# Patient Record
Sex: Female | Born: 1975 | Hispanic: No | Marital: Married | Smoking: Never smoker
Health system: Southern US, Community
[De-identification: ages and names within clinical notes are randomized; demographics above are authoritative.]

---

## 2001-08-22 ENCOUNTER — Inpatient Hospital Stay (HOSPITAL_COMMUNITY): Admission: AD | Admit: 2001-08-22 | Discharge: 2001-08-22 | Payer: Self-pay | Admitting: *Deleted

## 2002-01-04 ENCOUNTER — Inpatient Hospital Stay (HOSPITAL_COMMUNITY): Admission: AD | Admit: 2002-01-04 | Discharge: 2002-01-07 | Payer: Self-pay | Admitting: *Deleted

## 2004-09-02 ENCOUNTER — Emergency Department (HOSPITAL_COMMUNITY): Admission: EM | Admit: 2004-09-02 | Discharge: 2004-09-02 | Payer: Self-pay | Admitting: Emergency Medicine

## 2006-07-02 ENCOUNTER — Ambulatory Visit: Payer: Self-pay | Admitting: Nurse Practitioner

## 2006-07-03 ENCOUNTER — Ambulatory Visit: Payer: Self-pay | Admitting: *Deleted

## 2007-02-27 ENCOUNTER — Encounter (INDEPENDENT_AMBULATORY_CARE_PROVIDER_SITE_OTHER): Payer: Self-pay | Admitting: *Deleted

## 2007-06-19 ENCOUNTER — Ambulatory Visit: Payer: Self-pay | Admitting: Internal Medicine

## 2007-06-19 LAB — CONVERTED CEMR LAB
Basophils Relative: 0 % (ref 0–1)
Lymphs Abs: 2.3 10*3/uL (ref 0.7–4.0)
Monocytes Absolute: 0.4 10*3/uL (ref 0.1–1.0)
Monocytes Relative: 9 % (ref 3–12)
Neutro Abs: 2 10*3/uL (ref 1.7–7.7)
Neutrophils Relative %: 39 % — ABNORMAL LOW (ref 43–77)
WBC: 5 10*3/uL (ref 4.0–10.5)

## 2008-08-20 ENCOUNTER — Ambulatory Visit: Payer: Self-pay | Admitting: Internal Medicine

## 2009-02-24 ENCOUNTER — Ambulatory Visit: Payer: Self-pay | Admitting: Family Medicine

## 2009-02-24 ENCOUNTER — Encounter (INDEPENDENT_AMBULATORY_CARE_PROVIDER_SITE_OTHER): Payer: Self-pay | Admitting: Adult Health

## 2009-02-24 LAB — CONVERTED CEMR LAB
ALT: 10 units/L (ref 0–35)
AST: 15 units/L (ref 0–37)
Albumin: 4 g/dL (ref 3.5–5.2)
Basophils Absolute: 0 10*3/uL (ref 0.0–0.1)
Basophils Relative: 0 % (ref 0–1)
CO2: 22 meq/L (ref 19–32)
Calcium: 9.2 mg/dL (ref 8.4–10.5)
Eosinophils Relative: 2 % (ref 0–5)
Glucose, Bld: 78 mg/dL (ref 70–99)
HCT: 32.8 % — ABNORMAL LOW (ref 36.0–46.0)
Lymphs Abs: 2.4 10*3/uL (ref 0.7–4.0)
Monocytes Relative: 8 % (ref 3–12)
Neutro Abs: 2.3 10*3/uL (ref 1.7–7.7)
RBC: 3.8 M/uL — ABNORMAL LOW (ref 3.87–5.11)
Total Protein: 7.2 g/dL (ref 6.0–8.3)

## 2010-12-02 ENCOUNTER — Ambulatory Visit (HOSPITAL_COMMUNITY)
Admission: RE | Admit: 2010-12-02 | Discharge: 2010-12-02 | Disposition: A | Discharge status: Home / Self Care | Payer: Self-pay | Source: Ambulatory Visit | Attending: Family Medicine | Admitting: Family Medicine

## 2010-12-02 ENCOUNTER — Other Ambulatory Visit (HOSPITAL_COMMUNITY): Payer: Self-pay | Admitting: Family Medicine

## 2010-12-02 DIAGNOSIS — M545 Low back pain, unspecified: Secondary | ICD-10-CM | POA: Insufficient documentation

## 2010-12-02 DIAGNOSIS — R52 Pain, unspecified: Secondary | ICD-10-CM

## 2010-12-02 DIAGNOSIS — M546 Pain in thoracic spine: Secondary | ICD-10-CM | POA: Insufficient documentation

## 2011-07-20 ENCOUNTER — Emergency Department (HOSPITAL_COMMUNITY)
Admission: EM | Admit: 2011-07-20 | Discharge: 2011-07-20 | Disposition: A | Discharge status: Home / Self Care | Payer: PRIVATE HEALTH INSURANCE | Attending: Emergency Medicine | Admitting: Emergency Medicine

## 2011-07-20 ENCOUNTER — Encounter (HOSPITAL_COMMUNITY): Payer: Self-pay | Admitting: Emergency Medicine

## 2011-07-20 DIAGNOSIS — H579 Unspecified disorder of eye and adnexa: Secondary | ICD-10-CM | POA: Insufficient documentation

## 2011-07-20 DIAGNOSIS — Z77098 Contact with and (suspected) exposure to other hazardous, chiefly nonmedicinal, chemicals: Secondary | ICD-10-CM | POA: Insufficient documentation

## 2011-07-20 DIAGNOSIS — IMO0002 Reserved for concepts with insufficient information to code with codable children: Secondary | ICD-10-CM | POA: Insufficient documentation

## 2011-07-20 DIAGNOSIS — H11419 Vascular abnormalities of conjunctiva, unspecified eye: Secondary | ICD-10-CM | POA: Insufficient documentation

## 2011-07-20 DIAGNOSIS — H5789 Other specified disorders of eye and adnexa: Secondary | ICD-10-CM | POA: Insufficient documentation

## 2011-07-20 MED ORDER — FLUORESCEIN SODIUM 1 MG OP STRP
1.0000 | ORAL_STRIP | Freq: Once | OPHTHALMIC | Status: DC
  Filled 2011-07-20: qty 1

## 2011-07-20 MED ORDER — TETRACAINE HCL 0.5 % OP SOLN
2.0000 [drp] | Freq: Once | OPHTHALMIC | Status: DC
  Filled 2011-07-20: qty 2

## 2011-07-20 NOTE — ED Notes (Signed)
Patient states both eyes are burning after splashing cleaning fluid into both eyes. Patient left eye appears red and the right eye is clear. Neither right or left eye appear to be swollen. Patient resting and watching TV with NAD at this time.

## 2011-07-20 NOTE — ED Provider Notes (Signed)
I saw and evaluated the patient, reviewed the resident's note and I agree with the findings and plan.  Chemicals splash to left eye. No change in vision, burning, itching or pain. Irrigated for 20 minutes. PH is 7.  No fluorescein uptake. Vision normal.  Glynn Octave, MD 07/20/11 2031

## 2011-07-20 NOTE — ED Notes (Signed)
Flush complete. Patient states her left eye fells better.

## 2011-07-20 NOTE — ED Notes (Signed)
Pt working and sts A456 cleaner splashed into left eye; eye redness noted

## 2011-07-20 NOTE — ED Notes (Signed)
Morgan Lens placed in patients left eye with 1L warm NS flushing. Patient resting with NAD at this time.

## 2011-07-20 NOTE — ED Provider Notes (Signed)
History     CSN: [id number]  Arrival date & time 07/20/11  1547   First MD Initiated Contact with Patient 07/20/11 1601      Chief Complaint  Patient presents with  . Chemical Exposure    (Consider location/radiation/quality/duration/timing/severity/associated sxs/prior treatment) Patient is a 36 y.o. female presenting with eye problem.  Eye Problem  This is a new problem. The current episode started less than 1 hour ago. The problem occurs constantly. The problem has been rapidly improving. The patient is experiencing no pain. She does not wear contacts. Associated symptoms include eye redness. Pertinent negatives include no blurred vision, no decreased vision, no discharge and no photophobia. Treatments tried: irrigation.    History reviewed. No pertinent past medical history.  History reviewed. No pertinent past surgical history.  History reviewed. No pertinent family history.  History  Substance Use Topics  . Smoking status: Never Smoker   . Smokeless tobacco: Not on file  . Alcohol Use: No    OB History    Grav Para Term Preterm Abortions TAB SAB Ect Mult Living                  Review of Systems  HENT: Negative for facial swelling.   Eyes: Positive for redness and itching. Negative for blurred vision, photophobia, pain, discharge and visual disturbance.  All other systems reviewed and are negative.    Allergies  Review of patient's allergies indicates no known allergies.  Home Medications  No current outpatient prescriptions on file.  BP 156/101  Pulse 60  Temp(Src) 98.5 F (36.9 C) (Oral)  Resp 18  SpO2 99%  Physical Exam  Constitutional: She is oriented to person, place, and time. She appears well-developed and well-nourished. No distress.  HENT:  Head: Atraumatic.  Nose: Nose normal.  Eyes: EOM are normal. Pupils are equal, round, and reactive to light. Right eye exhibits no discharge and no exudate. No foreign body present in the right eye.  Left eye exhibits no discharge and no exudate. No foreign body present in the left eye. Left conjunctiva is injected.  Neck: Normal range of motion. Neck supple.  Cardiovascular: Intact distal pulses.   Pulmonary/Chest: Not tachypneic. No respiratory distress.  Abdominal: Soft. There is no tenderness.  Musculoskeletal: Normal range of motion.  Neurological: She is alert and oriented to person, place, and time. Gait normal.  Psychiatric: She has a normal mood and affect. Her speech is normal and behavior is normal.    ED Course  Procedures (including critical care time)  Labs Reviewed - No data to display No results found.   1. Chemical exposure of eye       MDM  37 yo female with left eye chemical exposure to A456 Disinfectant.  She states a small amount of diluted cleaner was splashed in her left eye.  No pain, burning, or itching.  Irrigated it for 15 minutes prior to arrival.  pH between 7-8.  VA 20/20.  Mild conjunctival injection.  No tearing.  No fluorescein uptake.  Will irrigate.  Irrigated.  Pt feels better, will dc home.        Warnell Forester, MD 07/20/11 1758

## 2012-06-24 ENCOUNTER — Encounter: Payer: Self-pay | Admitting: Internal Medicine

## 2012-06-24 ENCOUNTER — Ambulatory Visit (INDEPENDENT_AMBULATORY_CARE_PROVIDER_SITE_OTHER): Payer: PRIVATE HEALTH INSURANCE | Admitting: Internal Medicine

## 2012-06-24 VITALS — BP 118/77 | HR 84 | Temp 97.7°F | Ht 64.0 in | Wt 186.4 lb

## 2012-06-24 DIAGNOSIS — D649 Anemia, unspecified: Secondary | ICD-10-CM

## 2012-06-24 DIAGNOSIS — M2550 Pain in unspecified joint: Secondary | ICD-10-CM

## 2012-06-24 LAB — CBC WITH DIFFERENTIAL/PLATELET
Basophils Absolute: 0 10*3/uL (ref 0.0–0.1)
Eosinophils Absolute: 0.1 10*3/uL (ref 0.0–0.7)
Eosinophils Relative: 1 % (ref 0–5)
Hemoglobin: 11 g/dL — ABNORMAL LOW (ref 12.0–15.0)
Lymphs Abs: 2.1 10*3/uL (ref 0.7–4.0)
MCH: 28.6 pg (ref 26.0–34.0)
MCHC: 33.1 g/dL (ref 30.0–36.0)
Platelets: 168 10*3/uL (ref 150–400)
RBC: 3.85 MIL/uL — ABNORMAL LOW (ref 3.87–5.11)
WBC: 4.6 10*3/uL (ref 4.0–10.5)

## 2012-06-24 LAB — TSH: TSH: 1.51 u[IU]/mL (ref 0.350–4.500)

## 2012-06-24 LAB — CK: Total CK: 242 U/L — ABNORMAL HIGH (ref 7–177)

## 2012-06-24 NOTE — Patient Instructions (Addendum)
-  I have drawn a lot of labs for concerns of different types of arthritis or muscle pains, I will let you know when I get the results  Please be sure to bring all of your medications with you to every visit.  Should you have any new or worsening symptoms, please be sure to call the clinic at [phone].

## 2012-06-24 NOTE — Assessment & Plan Note (Addendum)
4-6 weeks in duration, seems to be worsening, age & bilateral in nature suggests high suspicion for rheumatological etiology.  Cannot r/o thyroid disorder or myositis/myalgia.    Check TSH, ESR, CRP, CK, RF, anti CCP, ANA Check urine for sediment Check CMET for electrolyte derangements/kidney fxn Check CBC with diff to screen for infectious etiology  ADDENDUM: all labs wnl except CK mildly elevated, which may suggestion inclusion body myositis, but may also occur in black patients as well (per uptodate); will have patient repeat CK lab work next week.  Patient aware of results and will make lab appt

## 2012-06-24 NOTE — Progress Notes (Signed)
Subjective:   Patient ID: Andrea Bird female   DOB: 1976/02/25 36 y.o.   MRN: [id number]  HPI: Andrea Bird is a 37 y.o. woman with no significant PMH who presents with c/o diffuse joint and muscle pains.  She reports breast tenderness, pain in wrists, elbows, waist, upper & low back, but it is not clear if pain is only in joints or if there is a muscular component as well.  She is not able to describe the pain well, but I believe this to be due to a language barrier. She reports pain was intermittent in the past but now more frequent to almost constant.  She reports pain currently and notes last being pain free on the night prior.  She was recently seen by another physician on 06/10/12 (records pending) who prescribed ranitidine & ibuprofen, from which she has received minimal relief. She notes occasional difficulty swallowing of both liquids and solids with dry mouth but denies dry eyes.  She denies SOB, dizziness & HA.  No numbness or tingling. She has never had symptoms as this before, nor does she recall her parents having similar symptoms.  No heart palpitations. No problems with BMs or urination.  No rashes. No vision changes.  No past medical history on file.  Current Outpatient Prescriptions  Medication Sig Dispense Refill  . ibuprofen (ADVIL,MOTRIN) 800 MG tablet Take 800 mg by mouth 3 (three) times daily.      . ranitidine (ZANTAC) 300 MG tablet Take 300 mg by mouth at bedtime.       No family history on file.  History   Social History  . Marital Status: Divorced    Spouse Name: N/A    Number of Children: N/A  . Years of Education: N/A   Social History Main Topics  . Smoking status: Never Smoker   . Smokeless tobacco: None  . Alcohol Use: No  . Drug Use: No  . Sexually Active:    Other Topics Concern  . 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  Patient is a well-developed and well-nourished woman in no acute distress and cooperative with exam.  Head: Normocephalic and atraumatic Mouth: no erythema or exudates Eyes: PERRL, EOMI, conjunctivae normal, No scleral icterus.  Neck: Supple, Trachea midline normal ROM, No JVD, mass, thyromegaly present.  Cardiovascular: RRR, S1 normal, S2 normal, no MRG, pulses symmetric and intact bilaterally Pulmonary/Chest: CTAB, no wheezes, rales, or rhonchi Abdominal: Soft. Non-tender, non-distended, bowel sounds are normal, no masses, organomegaly, or guarding present.  Musculoskeletal: No joint deformities, erythema, or stiffness, ROM full and nontender, but she did present with 4/5 muscle strength with upper extremities being weaker than lower. Hematology: no cervical adenopathy.  Neurological: A&O x3, cranial nerve II-XII are grossly intact, no focal motor deficit, sensory intact to light touch bilaterally.  Skin: Warm, dry and intact. No rash, cyanosis, or clubbing.  Psychiatric: Normal mood and affect. speech and behavior is normal. Judgment and thought content normal. Cognition and memory are normal.   Assessment & Plan:  Case and care discussed with Dr. Dalphine Handing. Please see problem oriented  charting for further details.

## 2012-06-25 LAB — COMPLETE METABOLIC PANEL WITH GFR
AST: 16 U/L (ref 0–37)
Albumin: 4.2 g/dL (ref 3.5–5.2)
Alkaline Phosphatase: 51 U/L (ref 39–117)
BUN: 14 mg/dL (ref 6–23)
CO2: 26 mEq/L (ref 19–32)
Chloride: 105 mEq/L (ref 96–112)
Glucose, Bld: 75 mg/dL (ref 70–99)
Potassium: 4.1 mEq/L (ref 3.5–5.3)
Sodium: 139 mEq/L (ref 135–145)
Total Bilirubin: 0.3 mg/dL (ref 0.3–1.2)

## 2012-06-25 LAB — URINALYSIS, ROUTINE W REFLEX MICROSCOPIC
Leukocytes, UA: NEGATIVE
Nitrite: NEGATIVE
Protein, ur: NEGATIVE mg/dL
pH: 6 (ref 5.0–8.0)

## 2012-06-25 LAB — ANA: Anti Nuclear Antibody(ANA): NEGATIVE

## 2012-06-25 LAB — SEDIMENTATION RATE: Sed Rate: 11 mm/hr (ref 0–22)

## 2012-06-25 LAB — CYCLIC CITRUL PEPTIDE ANTIBODY, IGG: Cyclic Citrullin Peptide Ab: <2 U/mL (ref 0.0–5.0)

## 2012-06-27 DIAGNOSIS — D649 Anemia, unspecified: Secondary | ICD-10-CM | POA: Insufficient documentation

## 2012-06-27 NOTE — Assessment & Plan Note (Signed)
Unclear etiology, will check anemia panel with repeat blood work (CK) to be done on 07/03/12.

## 2012-06-27 NOTE — Addendum Note (Signed)
Addended by: Belia Heman on: 06/27/2012 11:55 AM   Modules accepted: Orders

## 2012-07-03 ENCOUNTER — Other Ambulatory Visit (INDEPENDENT_AMBULATORY_CARE_PROVIDER_SITE_OTHER): Payer: 59

## 2012-07-03 DIAGNOSIS — M2550 Pain in unspecified joint: Secondary | ICD-10-CM

## 2012-07-03 DIAGNOSIS — D649 Anemia, unspecified: Secondary | ICD-10-CM

## 2012-07-04 LAB — ANEMIA PANEL
%SAT: 22 % (ref 20–55)
ABS Retic: 93.6 10*3/uL (ref 19.0–186.0)
Folate: 9.7 ng/mL
RBC.: 4.07 MIL/uL (ref 3.87–5.11)
Retic Ct Pct: 2.3 % (ref 0.4–2.3)
UIBC: 281 ug/dL (ref 125–400)
Vitamin B-12: 372 pg/mL (ref 211–911)

## 2012-07-04 LAB — CK TOTAL AND CKMB (NOT AT ARMC)
CK, MB: 6.2 ng/mL — ABNORMAL HIGH (ref 0.3–4.0)
Relative Index: 1.1 (ref 0.0–2.5)

## 2012-07-05 ENCOUNTER — Other Ambulatory Visit: Payer: Self-pay | Admitting: Internal Medicine

## 2012-07-12 ENCOUNTER — Other Ambulatory Visit: Payer: Self-pay | Admitting: Internal Medicine

## 2012-07-12 DIAGNOSIS — Z1231 Encounter for screening mammogram for malignant neoplasm of breast: Secondary | ICD-10-CM

## 2012-07-12 DIAGNOSIS — N644 Mastodynia: Secondary | ICD-10-CM

## 2012-07-16 ENCOUNTER — Ambulatory Visit (HOSPITAL_COMMUNITY)
Admission: RE | Admit: 2012-07-16 | Discharge: 2012-07-16 | Disposition: A | Discharge status: Home / Self Care | Payer: 59 | Source: Ambulatory Visit | Attending: Internal Medicine | Admitting: Internal Medicine

## 2012-07-16 DIAGNOSIS — Z1231 Encounter for screening mammogram for malignant neoplasm of breast: Secondary | ICD-10-CM

## 2012-07-23 NOTE — Addendum Note (Signed)
Addended by: Angelina Ok F on: 07/23/2012 11:36 AM   Modules accepted: Orders

## 2012-08-05 ENCOUNTER — Ambulatory Visit
Admission: RE | Admit: 2012-08-05 | Discharge: 2012-08-05 | Disposition: A | Discharge status: Home / Self Care | Payer: 59 | Source: Ambulatory Visit | Attending: Internal Medicine | Admitting: Internal Medicine

## 2012-08-05 DIAGNOSIS — N644 Mastodynia: Secondary | ICD-10-CM

## 2014-04-13 ENCOUNTER — Encounter: Payer: Self-pay | Admitting: Internal Medicine

## 2014-10-21 ENCOUNTER — Encounter: Payer: Self-pay | Admitting: *Deleted

## 2014-11-04 ENCOUNTER — Other Ambulatory Visit (INDEPENDENT_AMBULATORY_CARE_PROVIDER_SITE_OTHER): Payer: 59

## 2014-11-04 ENCOUNTER — Ambulatory Visit (INDEPENDENT_AMBULATORY_CARE_PROVIDER_SITE_OTHER): Payer: 59 | Admitting: Family

## 2014-11-04 ENCOUNTER — Encounter: Payer: Self-pay | Admitting: Family

## 2014-11-04 VITALS — BP 100/68 | HR 65 | Temp 97.7°F | Resp 18 | Ht 64.0 in | Wt 180.8 lb

## 2014-11-04 DIAGNOSIS — M2550 Pain in unspecified joint: Secondary | ICD-10-CM

## 2014-11-04 LAB — CBC
HCT: 36.5 % (ref 36.0–46.0)
HEMOGLOBIN: 12.5 g/dL (ref 12.0–15.0)
MCHC: 34.3 g/dL (ref 30.0–36.0)
MCV: 84.6 fl (ref 78.0–100.0)
Platelets: 226 10*3/uL (ref 150.0–400.0)
RBC: 4.32 Mil/uL (ref 3.87–5.11)
RDW: 12.9 % (ref 11.5–15.5)
WBC: 4.5 10*3/uL (ref 4.0–10.5)

## 2014-11-04 LAB — C-REACTIVE PROTEIN: CRP: 0.1 mg/dL — AB (ref 0.5–20.0)

## 2014-11-04 LAB — SEDIMENTATION RATE: Sed Rate: 21 mm/hr (ref 0–22)

## 2014-11-04 NOTE — Patient Instructions (Signed)
Thank you for choosing [facility].  Summary/Instructions:  Please stop by the lab on the basement level of the building for your blood work. Your results will be released to MyChart (or called to you) after review, usually within 72 hours after test completion. If any changes need to be made, you will be notified at that same time.  If your symptoms worsen or fail to improve, please contact our office for further instruction, or in case of emergency go directly to the emergency room at the closest medical facility.     

## 2014-11-04 NOTE — Progress Notes (Signed)
Subjective:    Patient ID: Andrea Bird, female    DOB: December 29, 1975, 39 y.o.   MRN: [id number]  Chief Complaint  Patient presents with  . Establish Care    has been having back and shoulder pain, x1 year     HPI:  Andrea Bird is a 39 y.o. female with a PMH of normocytic anemia and multiple joint pain who presents today for an office visit to establish care.    1) Multiple joint pain - Associated symptom of pain located in her bilateral shoulder, chest, low back have been hurting for about a year. It is exacerbated by bending down and picking up something. Indicates that everytime she eats it exacerbates her chest. Cannot pick up heavy things. Unable to describe any of the pain only indicating that it hurts. The only modifying factor that makes it better is when she is not working. Denies any trauma to any of there area.  She was previously seen for similar concerns found to have an elevated creatinine kinase.   No Known Allergies   Outpatient Prescriptions Prior to Visit  Medication Sig Dispense Refill  . ibuprofen (ADVIL,MOTRIN) 800 MG tablet Take 800 mg by mouth 3 (three) times daily.    . ranitidine (ZANTAC) 300 MG tablet Take 300 mg by mouth at bedtime.     No facility-administered medications prior to visit.     History reviewed. No pertinent past medical history.   History reviewed. No pertinent past surgical history.   Family History  Problem Relation Age of Onset  . Healthy Mother   . Healthy Father     History   Social History  . Marital Status: Divorced    Spouse Name: N/A  . Number of Children: 1  . Years of Education: 14   Occupational History  . Environmental Services    Social History Main Topics  . Smoking status: Never Smoker   . Smokeless tobacco: Never Used  . Alcohol Use: No  . Drug Use: No  . Sexual Activity: Not on file   Other Topics Concern  . 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  Musculoskeletal: Positive for back pain and arthralgias.  Neurological: Negative for numbness and headaches.      Objective:    BP 100/68 mmHg  Pulse 65  Temp(Src) 97.7 F (36.5 C) (Oral)  Resp 18  Ht 5\' 4"  (1.626 m)  Wt 180 lb 12.8 oz (82.01 kg)  BMI 31.02 kg/m2  SpO2 99% Nursing note and vital signs reviewed.  Physical Exam  Constitutional: She is oriented to person, place, and time. She appears well-developed and well-nourished. No distress.  Neck:  Neck: No obvious deformity, discoloration, or edema. Unable to elicit palpable tenderness. Neck range of motion is full in all directions with no pain. Negative compression test and Spurling's test.  Cardiovascular: Normal rate, regular rhythm, normal heart sounds and intact distal pulses.   Pulmonary/Chest: Effort normal and breath sounds normal.  Musculoskeletal:  Bilateral shoulders: No obvious deformity, discoloration, or edema noted. No palpable tenderness able to be elicited. Displays full range of motion bilaterally with no pain. Strength is 4+ out of 5 in all directions. Ligamentous in impingement testing are negative.  Low back: No obvious deformity, discoloration, or edema of low back noted. Unable to elicit palpable tenderness upon palpation.  Patient displays full range of motion in all directions with no pain. Straight leg raise is negative. Distal pulses, sensation, and reflexes are intact and appropriate.  Neurological: She is alert and oriented to person, place, and time.  Skin: Skin is warm and dry.  Psychiatric: She has a normal mood and affect. Her behavior is normal. Judgment and thought content normal.       Assessment & Plan:   Problem List Items Addressed This Visit      Other   Multiple joint pain - Primary    Symptoms and exam inconsistent with any specific pathology and consistent with repetitive  motion given that symptoms are relieved when not at work. Continue conservative treatment at this time with 800 mg of ibuprofen 3 times a day for pain and inflammation. Recommend trialing a heat pack as needed. Discussed and demonstrated proper lifting techniques which patient understands. Obtain CBC, sedimentation rate, C-reactive protein, and creatinine kinase. Follow-up in one month to determine continued pain.      Relevant Orders   Sed Rate (ESR) (Completed)   C-reactive protein (Completed)   CK Total (and CKMB)   CBC (Completed)

## 2014-11-04 NOTE — Assessment & Plan Note (Addendum)
Symptoms and exam inconsistent with any specific pathology and consistent with repetitive motion given that symptoms are relieved when not at work. Continue conservative treatment at this time with 800 mg of ibuprofen 3 times a day for pain and inflammation. Recommend trialing a heat pack as needed. Discussed and demonstrated proper lifting techniques which patient understands. Obtain CBC, sedimentation rate, C-reactive protein, and creatinine kinase. Follow-up in one month to determine continued pain.

## 2014-11-04 NOTE — Progress Notes (Signed)
Pre visit review using our clinic review tool, if applicable. No additional management support is needed unless otherwise documented below in the visit note. 

## 2014-11-05 ENCOUNTER — Telehealth: Payer: Self-pay | Admitting: Family

## 2014-11-05 LAB — CK TOTAL AND CKMB (NOT AT ARMC)
CK, MB: 9.4 ng/mL — ABNORMAL HIGH (ref 0.0–5.0)
RELATIVE INDEX: 1.7 (ref 0.0–4.0)
Total CK: 561 U/L — ABNORMAL HIGH (ref 7–177)

## 2014-11-05 NOTE — Telephone Encounter (Signed)
Received value of elevated CK. This is consistent with previous readings. Will refer to rhematology for possible myositis.

## 2014-11-05 NOTE — Telephone Encounter (Signed)
Revonda StandardAllison with [facility] called to let you know patient's CKMB is Alert High 9.4

## 2014-11-06 ENCOUNTER — Telehealth: Payer: Self-pay | Admitting: Family

## 2014-11-06 DIAGNOSIS — R748 Abnormal levels of other serum enzymes: Secondary | ICD-10-CM

## 2014-11-06 NOTE — Telephone Encounter (Signed)
Please inform the patient that her lab results continue to show elevation of enzymes in her body consistent with muscle breakdown. Therefore I have placed a referral for her to see rhuematology for this.

## 2014-11-11 NOTE — Telephone Encounter (Signed)
Left detailed message letting pt know the results below.

## 2014-12-21 LAB — HEPATIC FUNCTION PANEL
ALT: 17 U/L (ref 7–35)
AST: 18 U/L (ref 13–35)
Alkaline Phosphatase: 56 U/L (ref 25–125)
BILIRUBIN, TOTAL: 0.7 mg/dL

## 2014-12-21 LAB — BASIC METABOLIC PANEL
BUN: 14 mg/dL (ref 4–21)
CREATININE: 0.7 mg/dL (ref <=1.1)
Glucose: 78 mg/dL
Potassium: 3.9 mmol/L (ref 3.4–5.3)
Sodium: 141 mmol/L (ref 137–147)

## 2014-12-30 ENCOUNTER — Encounter: Payer: Self-pay | Admitting: Family

## 2015-05-19 ENCOUNTER — Ambulatory Visit (INDEPENDENT_AMBULATORY_CARE_PROVIDER_SITE_OTHER): Payer: 59 | Admitting: Family

## 2015-05-19 ENCOUNTER — Encounter: Payer: Self-pay | Admitting: Family

## 2015-05-19 VITALS — BP 104/80 | HR 79 | Temp 97.8°F | Resp 16 | Ht 64.0 in | Wt 201.8 lb

## 2015-05-19 DIAGNOSIS — R0789 Other chest pain: Secondary | ICD-10-CM | POA: Insufficient documentation

## 2015-05-19 DIAGNOSIS — M542 Cervicalgia: Secondary | ICD-10-CM | POA: Insufficient documentation

## 2015-05-19 MED ORDER — NAPROXEN 500 MG PO TABS: 500.0000 mg | ORAL_TABLET | Freq: Two times a day (BID) | ORAL | Status: DC

## 2015-05-19 MED ORDER — PANTOPRAZOLE SODIUM 40 MG PO TBEC: 40.0000 mg | DELAYED_RELEASE_TABLET | Freq: Every day | ORAL | Status: DC

## 2015-05-19 NOTE — Progress Notes (Signed)
Subjective:    Patient ID: Andrea Bird, female    DOB: 04-28-1976, 39 y.o.   MRN: [id number]  Chief Complaint  Patient presents with  . Back Pain    has back pain at the top of her back that goes to her chest x2 years    HPI:  Andrea Bird is a 39 y.o. female who  has no past medical history on file. and presents today for a follow up office visit.   Seen in July 2016 by Rhumatology following last office appointment and diagnosed with polyarthralgia, insomnia and back ache. The elevated CK is believed to be a benign situation given her increased muscle mass. There is some concern for fibromyalgia. She was started on flexeril. Takes the medication as prescribed and does not feel significant improvement. Continues to experience the associated symptoms of back and chest pain. Pain is described as burning especially after she eats. Denies any radiating pains, chest pain with exertion, or shortness of breath.   No Known Allergies   Current Outpatient Prescriptions on File Prior to Visit  Medication Sig Dispense Refill  . ibuprofen (ADVIL,MOTRIN) 800 MG tablet Take 800 mg by mouth 3 (three) times daily.     No current facility-administered medications on file prior to visit.    Review of Systems  Constitutional: Negative for fever and chills.  Gastrointestinal: Negative for nausea, vomiting, abdominal pain, diarrhea and constipation.  Musculoskeletal: Positive for back pain and neck pain.  Neurological: Negative for weakness and numbness.      Objective:    BP 104/80 mmHg  Pulse 79  Temp(Src) 97.8 F (36.6 C) (Oral)  Resp 16  Ht 5\' 4"  (1.626 m)  Wt 201 lb 12.8 oz (91.536 kg)  BMI 34.62 kg/m2  SpO2 98% Nursing note and vital signs reviewed.  Physical Exam  Constitutional: She is oriented to person, place, and time. She appears well-developed and well-nourished. No distress.  Neck:  No obvious deformity, discoloration, or edema noted. Mild tenderness of paraspinal  musculature. Range of motion is intact and appropriate. Negative compression and Spurling's. Distal pulses, sensation, and reflexes are intact and appropriate.  Cardiovascular: Normal rate, regular rhythm, normal heart sounds and intact distal pulses.   Pulmonary/Chest: Effort normal and breath sounds normal.  Abdominal: Soft. Bowel sounds are normal. She exhibits no distension and no mass. There is no tenderness. There is no rebound and no guarding.  Neurological: She is alert and oriented to person, place, and time.  Skin: Skin is warm and dry.  Psychiatric: She has a normal mood and affect. Her behavior is normal. Judgment and thought content normal.       Assessment & Plan:   Problem List Items Addressed This Visit      Other   Muscle pain, cervical - Primary    Symptoms consistent with cervical muscle spasms as fibromyalgia points are minimal at present. Treat conservatively with naproxen, heat, and home exercise therapy. Refer to physical therapy for further assessment and management. Follow-up in one month or if symptoms worsen or fail to improve.      Relevant Medications   naproxen (NAPROSYN) 500 MG tablet   Other Relevant Orders   Ambulatory referral to Physical Therapy   H Pylori, IGM, IGG, IGA AB   Burning in the chest    Symptoms consistent with possible gastroesophageal reflux or peptic ulcer disease. Start pantoprazole. Obtain H. pylori. Follow-up in one month or sooner if needed.      Relevant Medications  pantoprazole (PROTONIX) 40 MG tablet

## 2015-05-19 NOTE — Patient Instructions (Signed)
Thank you for choosing [facility].  Summary/Instructions:  Your prescription(s) have been submitted to your pharmacy or been printed and provided for you. Please take as directed and contact our office if you believe you are having problem(s) with the medication(s) or have any questions.  Please stop by the lab on the basement level of the building for your blood work. Your results will be released to MyChart (or called to you) after review, usually within 72 hours after test completion. If any changes need to be made, you will be notified at that same time.  Referrals have been made during this visit. You should expect to hear back from our schedulers in about 7-10 days in regards to establishing an appointment with the specialists we discussed.   If your symptoms worsen or fail to improve, please contact our office for further instruction, or in case of emergency go directly to the emergency room at the closest medical facility.    Cervical Strain and Sprain With Rehab Cervical strain and sprain are injuries that commonly occur with "whiplash" injuries. Whiplash occurs when the neck is forcefully whipped backward or forward, such as during a motor vehicle accident or during contact sports. The muscles, ligaments, tendons, discs, and nerves of the neck are susceptible to injury when this occurs. RISK FACTORS Risk of having a whiplash injury increases if:  Osteoarthritis of the spine.  Situations that make head or neck accidents or trauma more likely.  High-risk sports (football, rugby, wrestling, hockey, auto racing, gymnastics, diving, contact karate, or boxing).  Poor strength and flexibility of the neck.  Previous neck injury.  Poor tackling technique.  Improperly fitted or padded equipment. SYMPTOMS   Pain or stiffness in the front or back of neck or both.  Symptoms may present immediately or up to 24 hours after injury.  Dizziness, headache, nausea, and  vomiting.  Muscle spasm with soreness and stiffness in the neck.  Tenderness and swelling at the injury site. PREVENTION  Learn and use proper technique (avoid tackling with the head, spearing, and head-butting; use proper falling techniques to avoid landing on the head).  Warm up and stretch properly before activity.  Maintain physical fitness:  Strength, flexibility, and endurance.  Cardiovascular fitness.  Wear properly fitted and padded protective equipment, such as padded soft collars, for participation in contact sports. PROGNOSIS  Recovery from cervical strain and sprain injuries is dependent on the extent of the injury. These injuries are usually curable in 1 week to 3 months with appropriate treatment.  RELATED COMPLICATIONS   Temporary numbness and weakness may occur if the nerve roots are damaged, and this may persist until the nerve has completely healed.  Chronic pain due to frequent recurrence of symptoms.  Prolonged healing, especially if activity is resumed too soon (before complete recovery). TREATMENT  Treatment initially involves the use of ice and medication to help reduce pain and inflammation. It is also important to perform strengthening and stretching exercises and modify activities that worsen symptoms so the injury does not get worse. These exercises may be performed at home or with a therapist. For patients who experience severe symptoms, a soft, padded collar may be recommended to be worn around the neck.  Improving your posture may help reduce symptoms. Posture improvement includes pulling your chin and abdomen in while sitting or standing. If you are sitting, sit in a firm chair with your buttocks against the back of the chair. While sleeping, try replacing your pillow with a small  towel rolled to 2 inches in diameter, or use a cervical pillow or soft cervical collar. Poor sleeping positions delay healing.  For patients with nerve root damage, which causes  numbness or weakness, the use of a cervical traction apparatus may be recommended. Surgery is rarely necessary for these injuries. However, cervical strain and sprains that are present at birth (congenital) may require surgery. MEDICATION   If pain medication is necessary, nonsteroidal anti-inflammatory medications, such as aspirin and ibuprofen, or other minor pain relievers, such as acetaminophen, are often recommended.  Do not take pain medication for 7 days before surgery.  Prescription pain relievers may be given if deemed necessary by your caregiver. Use only as directed and only as much as you need. HEAT AND COLD:   Cold treatment (icing) relieves pain and reduces inflammation. Cold treatment should be applied for 10 to 15 minutes every 2 to 3 hours for inflammation and pain and immediately after any activity that aggravates your symptoms. Use ice packs or an ice massage.  Heat treatment may be used prior to performing the stretching and strengthening activities prescribed by your caregiver, physical therapist, or athletic trainer. Use a heat pack or a warm soak. SEEK MEDICAL CARE IF:   Symptoms get worse or do not improve in 2 weeks despite treatment.  New, unexplained symptoms develop (drugs used in treatment may produce side effects). EXERCISES RANGE OF MOTION (ROM) AND STRETCHING EXERCISES - Cervical Strain and Sprain These exercises may help you when beginning to rehabilitate your injury. In order to successfully resolve your symptoms, you must improve your posture. These exercises are designed to help reduce the forward-head and rounded-shoulder posture which contributes to this condition. Your symptoms may resolve with or without further involvement from your physician, physical therapist or athletic trainer. While completing these exercises, remember:   Restoring tissue flexibility helps normal motion to return to the joints. This allows healthier, less painful movement and  activity.  An effective stretch should be held for at least 20 seconds, although you may need to begin with shorter hold times for comfort.  A stretch should never be painful. You should only feel a gentle lengthening or release in the stretched tissue. STRETCH- Axial Extensors  Lie on your back on the floor. You may bend your knees for comfort. Place a rolled-up hand towel or dish towel, about 2 inches in diameter, under the part of your head that makes contact with the floor.  Gently tuck your chin, as if trying to make a "double chin," until you feel a gentle stretch at the base of your head.  Hold __________ seconds. Repeat __________ times. Complete this exercise __________ times per day.  STRETCH - Axial Extension   Stand or sit on a firm surface. Assume a good posture: chest up, shoulders drawn back, abdominal muscles slightly tense, knees unlocked (if standing) and feet hip width apart.  Slowly retract your chin so your head slides back and your chin slightly lowers. Continue to look straight ahead.  You should feel a gentle stretch in the back of your head. Be certain not to feel an aggressive stretch since this can cause headaches later.  Hold for __________ seconds. Repeat __________ times. Complete this exercise __________ times per day. STRETCH - Cervical Side Bend   Stand or sit on a firm surface. Assume a good posture: chest up, shoulders drawn back, abdominal muscles slightly tense, knees unlocked (if standing) and feet hip width apart.  Without letting your nose or  shoulders move, slowly tip your right / left ear to your shoulder until your feel a gentle stretch in the muscles on the opposite side of your neck.  Hold __________ seconds. Repeat __________ times. Complete this exercise __________ times per day. STRETCH - Cervical Rotators   Stand or sit on a firm surface. Assume a good posture: chest up, shoulders drawn back, abdominal muscles slightly tense, knees  unlocked (if standing) and feet hip width apart.  Keeping your eyes level with the ground, slowly turn your head until you feel a gentle stretch along the back and opposite side of your neck.  Hold __________ seconds. Repeat __________ times. Complete this exercise __________ times per day. RANGE OF MOTION - Neck Circles   Stand or sit on a firm surface. Assume a good posture: chest up, shoulders drawn back, abdominal muscles slightly tense, knees unlocked (if standing) and feet hip width apart.  Gently roll your head down and around from the back of one shoulder to the back of the other. The motion should never be forced or painful.  Repeat the motion 10-20 times, or until you feel the neck muscles relax and loosen. Repeat __________ times. Complete the exercise __________ times per day. STRENGTHENING EXERCISES - Cervical Strain and Sprain These exercises may help you when beginning to rehabilitate your injury. They may resolve your symptoms with or without further involvement from your physician, physical therapist, or athletic trainer. While completing these exercises, remember:   Muscles can gain both the endurance and the strength needed for everyday activities through controlled exercises.  Complete these exercises as instructed by your physician, physical therapist, or athletic trainer. Progress the resistance and repetitions only as guided.  You may experience muscle soreness or fatigue, but the pain or discomfort you are trying to eliminate should never worsen during these exercises. If this pain does worsen, stop and make certain you are following the directions exactly. If the pain is still present after adjustments, discontinue the exercise until you can discuss the trouble with your clinician. STRENGTH - Cervical Flexors, Isometric  Face a wall, standing about 6 inches away. Place a small pillow, a ball about 6-8 inches in diameter, or a folded towel between your forehead and the  wall.  Slightly tuck your chin and gently push your forehead into the soft object. Push only with mild to moderate intensity, building up tension gradually. Keep your jaw and forehead relaxed.  Hold 10 to 20 seconds. Keep your breathing relaxed.  Release the tension slowly. Relax your neck muscles completely before you start the next repetition. Repeat __________ times. Complete this exercise __________ times per day. STRENGTH- Cervical Lateral Flexors, Isometric   Stand about 6 inches away from a wall. Place a small pillow, a ball about 6-8 inches in diameter, or a folded towel between the side of your head and the wall.  Slightly tuck your chin and gently tilt your head into the soft object. Push only with mild to moderate intensity, building up tension gradually. Keep your jaw and forehead relaxed.  Hold 10 to 20 seconds. Keep your breathing relaxed.  Release the tension slowly. Relax your neck muscles completely before you start the next repetition. Repeat __________ times. Complete this exercise __________ times per day. STRENGTH - Cervical Extensors, Isometric   Stand about 6 inches away from a wall. Place a small pillow, a ball about 6-8 inches in diameter, or a folded towel between the back of your head and the wall.  Slightly  tuck your chin and gently tilt your head back into the soft object. Push only with mild to moderate intensity, building up tension gradually. Keep your jaw and forehead relaxed.  Hold 10 to 20 seconds. Keep your breathing relaxed.  Release the tension slowly. Relax your neck muscles completely before you start the next repetition. Repeat __________ times. Complete this exercise __________ times per day. POSTURE AND BODY MECHANICS CONSIDERATIONS - Cervical Strain and Sprain Keeping correct posture when sitting, standing or completing your activities will reduce the stress put on different body tissues, allowing injured tissues a chance to heal and limiting  painful experiences. The following are general guidelines for improved posture. Your physician or physical therapist will provide you with any instructions specific to your needs. While reading these guidelines, remember:  The exercises prescribed by your provider will help you have the flexibility and strength to maintain correct postures.  The correct posture provides the optimal environment for your joints to work. All of your joints have less wear and tear when properly supported by a spine with good posture. This means you will experience a healthier, less painful body.  Correct posture must be practiced with all of your activities, especially prolonged sitting and standing. Correct posture is as important when doing repetitive low-stress activities (typing) as it is when doing a single heavy-load activity (lifting). PROLONGED STANDING WHILE SLIGHTLY LEANING FORWARD When completing a task that requires you to lean forward while standing in one place for a long time, place either foot up on a stationary 2- to 4-inch high object to help maintain the best posture. When both feet are on the ground, the low back tends to lose its slight inward curve. If this curve flattens (or becomes too large), then the back and your other joints will experience too much stress, fatigue more quickly, and can cause pain.  RESTING POSITIONS Consider which positions are most painful for you when choosing a resting position. If you have pain with flexion-based activities (sitting, bending, stooping, squatting), choose a position that allows you to rest in a less flexed posture. You would want to avoid curling into a fetal position on your side. If your pain worsens with extension-based activities (prolonged standing, working overhead), avoid resting in an extended position such as sleeping on your stomach. Most people will find more comfort when they rest with their spine in a more neutral position, neither too rounded nor  too arched. Lying on a non-sagging bed on your side with a pillow between your knees, or on your back with a pillow under your knees will often provide some relief. Keep in mind, being in any one position for a prolonged period of time, no matter how correct your posture, can still lead to stiffness. WALKING Walk with an upright posture. Your ears, shoulders, and hips should all line up. OFFICE WORK When working at a desk, create an environment that supports good, upright posture. Without extra support, muscles fatigue and lead to excessive strain on joints and other tissues. CHAIR:  A chair should be able to slide under your desk when your back makes contact with the back of the chair. This allows you to work closely.  The chair's height should allow your eyes to be level with the upper part of your monitor and your hands to be slightly lower than your elbows.  Body position:  Your feet should make contact with the floor. If this is not possible, use a foot rest.  Keep your ears  over your shoulders. This will reduce stress on your neck and low back.   This information is not intended to replace advice given to you by your health care provider. Make sure you discuss any questions you have with your health care provider.   Document Released: 05/29/2005 Document Revised: 06/19/2014 Document Reviewed: 09/10/2008 Elsevier Interactive Patient Education [employer]

## 2015-05-19 NOTE — Assessment & Plan Note (Signed)
Symptoms consistent with possible gastroesophageal reflux or peptic ulcer disease. Start pantoprazole. Obtain H. pylori. Follow-up in one month or sooner if needed.

## 2015-05-19 NOTE — Assessment & Plan Note (Signed)
Symptoms consistent with cervical muscle spasms as fibromyalgia points are minimal at present. Treat conservatively with naproxen, heat, and home exercise therapy. Refer to physical therapy for further assessment and management. Follow-up in one month or if symptoms worsen or fail to improve.

## 2015-05-19 NOTE — Progress Notes (Signed)
Pre visit review using our clinic review tool, if applicable. No additional management support is needed unless otherwise documented below in the visit note. 

## 2015-06-02 ENCOUNTER — Ambulatory Visit: Payer: 59 | Attending: Family | Admitting: Physical Therapy

## 2015-06-02 DIAGNOSIS — M546 Pain in thoracic spine: Secondary | ICD-10-CM | POA: Insufficient documentation

## 2015-06-02 DIAGNOSIS — R293 Abnormal posture: Secondary | ICD-10-CM | POA: Diagnosis present

## 2015-06-02 DIAGNOSIS — M542 Cervicalgia: Secondary | ICD-10-CM | POA: Insufficient documentation

## 2015-06-02 DIAGNOSIS — R29898 Other symptoms and signs involving the musculoskeletal system: Secondary | ICD-10-CM | POA: Insufficient documentation

## 2015-06-02 NOTE — Therapy (Signed)
[facility] [address] [place], [place], [address] Phone: [phone]   Fax:  [phone]  Physical Therapy Evaluation  Patient Details  Name: Andrea Bird MRN: [id number] Date of Birth: [birth year] Referring Provider: Dr. Jeanine LuzGregory Calone  Encounter Date: 06/02/2015      PT End of Session - 06/02/15 1506    Visit Number 1   Number of Visits 5   Date for PT Re-Evaluation 07/07/15   PT Start Time 1415   PT Stop Time 1502   PT Time Calculation (min) 47 min   Activity Tolerance Patient tolerated treatment well   Behavior [facility] for tasks assessed/performed      No past medical history on file.  No past surgical history on file.  There were no vitals filed for this visit.  Visit Diagnosis:  Neck pain - Plan: PT plan of care cert/re-cert  Midline thoracic back pain - Plan: PT plan of care cert/re-cert  Upper extremity weakness - Plan: PT plan of care cert/re-cert  Abnormal posture - Plan: PT plan of care cert/re-cert      Subjective Assessment - 06/02/15 1418    Subjective Pt is a 39 y/o who presents to OPPT with 2 year hx history of increasing neck pain.  Pt presents today with ADLs and job responsibilities.   Limitations House hold activities   Diagnostic tests n/a   Patient Stated Goals improve pain   Currently in Pain? Yes   Pain Score 2   up to 5/10   Pain Location Neck  and upper thoracic spine   Pain Orientation Mid   Pain Descriptors / Indicators Sharp   Pain Type Chronic pain   Pain Onset More than a month ago   Pain Frequency Constant   Aggravating Factors  mopping   Pain Relieving Factors medication            [facility] PT Assessment - 06/02/15 1423    Assessment   Medical Diagnosis cervical/thoracic pain   Referring Provider Dr. Jeanine LuzGregory Calone   Onset Date/Surgical Date --  2 years   Hand Dominance Right   Next MD Visit PRN   Prior Therapy none   Precautions   Precautions  None   Restrictions   Weight Bearing Restrictions No   Balance Screen   Has the patient fallen in the past 6 months No   Has the patient had a decrease in activity level because of a fear of falling?  No   Is the patient reluctant to leave their home because of a fear of falling?  No   Prior Function   Level of Independence Independent   Vocation Full time employment   Vocation Requirements housekeeping at hospital; mopping, emptying trash cans, making beds, cleaning pt rooms   Observation/Other Assessments   Focus on Therapeutic Outcomes (FOTO)  69 (31% limited; predicted 30% limited)   Posture/Postural Control   Posture/Postural Control Postural limitations   Postural Limitations Rounded Shoulders;Forward head   AROM   Overall AROM Comments no pain except with rotation c/o "little bit: of pain   AROM Assessment Site Cervical   Cervical Flexion 32   Cervical Extension 40   Cervical - Right Side Bend 25   Cervical - Left Side Bend 30   Cervical - Right Rotation 40   Cervical - Left Rotation 40   Strength   Strength Assessment Site Shoulder;Elbow;Hand   Right Shoulder Flexion 4/5   Right Shoulder ABduction 4/5   Right  Shoulder Internal Rotation 3/5   Right Shoulder External Rotation 3/5   Left Shoulder Flexion 4/5   Left Shoulder ABduction 4/5   Left Shoulder Internal Rotation 3/5   Left Shoulder External Rotation 3/5   Right/Left Elbow Right;Left   Right Elbow Flexion 5/5   Right Elbow Extension 5/5   Left Elbow Flexion 5/5   Left Elbow Extension 5/5   Palpation   Palpation comment no significant tenderness except upper thoracic spine; bil mild scap winging noted                   [facility] Adult PT Treatment/Exercise - 06/02/15 1423    Modalities   Modalities Electrical Stimulation;Moist Heat   Moist Heat Therapy   Number Minutes Moist Heat 15 Minutes   Moist Heat Location Cervical  thoracic spine   Electrical Stimulation   Electrical Stimulation Location  upper thoracic spine   Electrical Stimulation Action IFC   Electrical Stimulation Parameters to tolerance   Electrical Stimulation Goals Pain                PT Education - 06/02/15 1505    Education provided Yes   Education Details clinical findings; POC, goals of care   Person(s) Educated Patient   Methods Explanation   Comprehension Verbalized understanding             PT Long Term Goals - 06/02/15 1508    PT LONG TERM GOAL #1   Title independent with HEP (06/30/15)   Time 4   Period Weeks   Status New   PT LONG TERM GOAL #2   Title report ability to perform work activities without increase in pain (06/30/15)   Time 4   Period Weeks   Status New   PT LONG TERM GOAL #3   Title verbalize understanding of posture/body mechanics to decrease risk of reinjury (06/30/15)   Time 4   Period Weeks   Status New               Plan - 06/02/15 1506    Clinical Impression Statement Pt is a 39 y/o female who presents to OPPT with upper thoracic and cervical pain mostly exacerbated by work activities.  Pt demonstrates poor posture and upper extremity weakness likely affecting pain and function.  Pt will benefit from PT to maximize funciton and address deficits listed above.   Pt will benefit from skilled therapeutic intervention in order to improve on the following deficits Decreased strength;Postural dysfunction;Pain   Rehab Potential Good   PT Frequency 1x / week   PT Duration 4 weeks   PT Treatment/Interventions ADLs/Self Care Home Management;Electrical Stimulation;Cryotherapy;Moist Heat;Therapeutic exercise;Therapeutic activities;Functional mobility training;Ultrasound;Traction;Patient/family education;Manual techniques;Dry needling   PT Next Visit Plan give HEP for posture (chest stretch, scap retraction, UE strengthening)   Consulted and Agree with Plan of Care Patient         Problem List Patient Active Problem List   Diagnosis Date Noted  . Muscle pain,  cervical 05/19/2015  . Burning in the chest 05/19/2015  . Normocytic anemia 06/27/2012  . Multiple joint pain 06/24/2012   Clarita Crane, PT, DPT 06/02/2015 3:13 PM  [facility] [address] [place], [place], [address] Phone: [phone]   Fax:  [phone]  Name: Andrea Bird MRN: [id number] Date of Birth: 1976-03-06

## 2015-06-16 ENCOUNTER — Ambulatory Visit: Payer: 59 | Attending: Family | Admitting: Physical Therapy

## 2015-06-16 DIAGNOSIS — R293 Abnormal posture: Secondary | ICD-10-CM | POA: Diagnosis not present

## 2015-06-16 DIAGNOSIS — M542 Cervicalgia: Secondary | ICD-10-CM | POA: Insufficient documentation

## 2015-06-16 DIAGNOSIS — R29898 Other symptoms and signs involving the musculoskeletal system: Secondary | ICD-10-CM | POA: Insufficient documentation

## 2015-06-16 DIAGNOSIS — M546 Pain in thoracic spine: Secondary | ICD-10-CM | POA: Insufficient documentation

## 2015-06-16 NOTE — Patient Instructions (Signed)
Ear / Shoulder Stretch    Exhaling, move left ear toward left shoulder. Hold position for _20-30__ seconds. Inhaling, bring head back to center. Repeat to other side. Repeat sequence _2-3__ times. Do _2-3__ times per day.  Copyright  VHI. All rights reserved.   Levator Scapula Stretch, Sitting    Sit, one hand tucked under hip on side to be stretched, other hand over top of head. Turn head toward other side and look down. Use hand on head to gently stretch neck in that position. Hold _20-30__ seconds. Repeat _2-3__ times per session. Do _2-3__ sessions per day.  Copyright  VHI. All rights reserved.   Lateral Shoulder Stretch    Bend one arm so elbow points straight ahead. Hold elbow with other hand and slowly pull toward body. A slight stretch should be felt behind shoulder and into back. Hold __20-30__ seconds. Repeat with other arm. Repeat _2-3___ times. Do __2-3__ sessions per day.  http://gt2.exer.us/223   Copyright  VHI. All rights reserved.   BACK: Child's Pose (Sciatica)    Sit in knee-chest position and reach arms forward. Separate knees for comfort. Hold position for _20-30__ seconds. Repeat _2-3__ times. Do _2-3__ times per day.  Copyright  VHI. All rights reserved.    Flexibility: Neck Retraction    Pull head straight back, keeping eyes and jaw level.  LIE DOWN FOR THIS EXERCISE! Repeat __10-15__ times per set. Do _1__ sets per session. Do _2-3__ sessions per day.  http://orth.exer.us/345   Copyright  VHI. All rights reserved.

## 2015-06-16 NOTE — Therapy (Addendum)
[facility] [address] [place], [place], [address] Phone: [phone]   Fax:  [phone]  Physical Therapy Treatment  Patient Details  Name: Andrea Bird MRN: [id number] Date of Birth: 1975-09-29 Referring Provider: Dr. Jeanine Luz  Encounter Date: 06/16/2015      PT End of Session - 06/16/15 1558    Visit Number 2   Number of Visits 5   Date for PT Re-Evaluation 07/07/15   PT Start Time 1315   PT Stop Time 1404   PT Time Calculation (min) 49 min   Activity Tolerance Patient tolerated treatment well;No increased pain   Behavior During Therapy [facility] for tasks assessed/performed      No past medical history on file.  No past surgical history on file.  There were no vitals filed for this visit.  Visit Diagnosis:  Neck pain  Upper extremity weakness  Midline thoracic back pain  Abnormal posture      Subjective Assessment - 06/16/15 1520    Subjective Had more pain in the morning, but it has decreased since then.     Patient Stated Goals improve pain   Currently in Pain? Yes   Pain Score 6    Pain Location Neck  upper back   Pain Orientation Mid   Pain Descriptors / Indicators Sharp   Pain Type Chronic pain   Pain Onset More than a month ago   Pain Frequency Constant   Aggravating Factors  mopping   Pain Relieving Factors meds                         [facility] Adult PT Treatment/Exercise - 06/16/15 1521    Exercises   Exercises Neck;Lumbar   Neck Exercises: Machines for Strengthening   UBE (Upper Arm Bike) L2.5 x 6 min; 3 min forward/3 min backward   Neck Exercises: Supine   Neck Retraction 10 reps;5 secs   Neck Retraction Limitations max cues, pt with limited participation in exercise   Modalities   Modalities Electrical Stimulation;Moist Heat   Moist Heat Therapy   Number Minutes Moist Heat 15 Minutes   Moist Heat Location Cervical  thoracic spine   Electrical Stimulation   Electrical Stimulation Location upper thoracic spine   Electrical Stimulation Action IFC   Electrical Stimulation Parameters to tolerance   Electrical Stimulation Goals Pain   Neck Exercises: Stretches   Upper Trapezius Stretch 2 reps;30 seconds   Upper Trapezius Stretch Limitations bil   Levator Stretch 2 reps;30 seconds   Levator Stretch Limitations bil   Chest Stretch 2 reps;30 seconds   Chest Stretch Limitations bil   Other Neck Stretches childs pose x 1 rep; mod cues needed for all stretches                PT Education - 06/16/15 1557    Education provided Yes   Education Details HEP   Person(s) Educated Patient   Methods Explanation;Demonstration;Tactile cues;Verbal cues;Handout   Comprehension Verbalized understanding;Returned demonstration;Verbal cues required;Tactile cues required;Need further instruction             PT Long Term Goals - 06/02/15 1508    PT LONG TERM GOAL #1   Title independent with HEP (06/30/15)   Time 4   Period Weeks   Status New   PT LONG TERM GOAL #2   Title report ability to perform work activities without increase in pain (06/30/15)   Time 4   Period Weeks  Status New   PT LONG TERM GOAL #3   Title verbalize understanding of posture/body mechanics to decrease risk of reinjury (06/30/15)   Time 4   Period Weeks   Status New               Plan - 06/16/15 1558    Clinical Impression Statement Pt needs mod to max cues for correct technique with exercises.  Pt with limited participation in exercises and needs max cues to perform correctly.  Reports pain decreased at end of session and feels estim helpful.   PT Next Visit Plan review HEP; add as needed; posture exercises (chest stretch, scap retraction, UE strengthening)   Consulted and Agree with Plan of Care Patient        Problem List Patient Active Problem List   Diagnosis Date Noted  . Muscle pain, cervical 05/19/2015  . Burning in the chest 05/19/2015  .  Normocytic anemia 06/27/2012  . Multiple joint pain 06/24/2012   Clarita CraneStephanie F Ashaun Gaughan, PT, DPT 06/16/2015 4:21 PM  [facility] [address] [place], [place], [address] Phone: [phone]   Fax:  [phone]  Name: Andrea Bird MRN: [id number] Date of Birth: December 06, 1975

## 2015-06-21 ENCOUNTER — Ambulatory Visit: Payer: 59

## 2015-06-30 ENCOUNTER — Ambulatory Visit: Payer: 59 | Admitting: Physical Therapy

## 2015-06-30 DIAGNOSIS — M542 Cervicalgia: Secondary | ICD-10-CM | POA: Diagnosis not present

## 2015-06-30 DIAGNOSIS — R29898 Other symptoms and signs involving the musculoskeletal system: Secondary | ICD-10-CM | POA: Diagnosis not present

## 2015-06-30 DIAGNOSIS — M546 Pain in thoracic spine: Secondary | ICD-10-CM

## 2015-06-30 DIAGNOSIS — R293 Abnormal posture: Secondary | ICD-10-CM | POA: Diagnosis not present

## 2015-06-30 NOTE — Therapy (Signed)
[place], [place], [address] Phone: [phone]   Fax:  [phone]  Physical Therapy Treatment/Discharge  Patient Details  Name: Andrea Bird MRN: [id number] Date of Birth: 12-21-1975 Referring Provider: Dr. Mauricio Po  Encounter Date: 06/30/2015      PT End of Session - 06/30/15 1549    Visit Number 3   Number of Visits 5   Date for PT Re-Evaluation 07/07/15   PT Start Time 2229   PT Stop Time 1547   PT Time Calculation (min) 32 min   Activity Tolerance Patient tolerated treatment well;No increased pain   Behavior During Therapy [facility] for tasks assessed/performed      No past medical history on file.  No past surgical history on file.  There were no vitals filed for this visit.  Visit Diagnosis:  Neck pain  Upper extremity weakness  Abnormal posture  Midline thoracic back pain      Subjective Assessment - 06/30/15 1517    Subjective Neck feels better; no pain x 4 days.  Wants to d/c today.   Limitations House hold activities   Patient Stated Goals improve pain   Currently in Pain? No/denies            [facility] PT Assessment - 06/30/15 1544    Observation/Other Assessments   Focus on Therapeutic Outcomes (FOTO)  96 (4% limited)                     [facility] Adult PT Treatment/Exercise - 06/30/15 1519    Self-Care   Self-Care ADL's;Lifting;Posture   ADL's reviewed proper technique to reduce reinjury risk pertaining to ADLs and work IT sales professional reviewed correct form and technique   Posture reviewed correct sitting and standing form   Neck Exercises: Machines for Strengthening   UBE (Upper Arm Bike) L2.5 x 6 min; 3 min forward/3 min backward   Neck Exercises: Supine   Neck Retraction 10 reps;5 secs   Neck Exercises: Stretches   Upper Trapezius Stretch 2 reps;30 seconds   Upper Trapezius Stretch Limitations bil   Levator Stretch 2 reps;30 seconds   Levator Stretch Limitations bil   Chest Stretch 2 reps;30 seconds   Chest Stretch Limitations bil   Other Neck Stretches childs pose x 2 reps                PT Education - 06/30/15 1546    Education provided Yes   Education Details see self care section   Person(s) Educated Patient   Methods Explanation;Handout   Comprehension Verbalized understanding;Returned demonstration             PT Long Term Goals - 06/30/15 1550    PT LONG TERM GOAL #1   Title independent with HEP (06/30/15)   Status Not Met   PT LONG TERM GOAL #2   Title report ability to perform work activities without increase in pain (06/30/15)   Status Achieved   PT LONG TERM GOAL #3   Title verbalize understanding of posture/body mechanics to decrease risk of reinjury (06/30/15)   Status Achieved               Plan - 06/30/15 1549    Clinical Impression Statement Pt reports no pain x 4 days and ready for d/c.  Pt has met 2 of 3 LTGs.  LTG #1 not met as pt need mod to max verbal cues for technique with exercises.  Pt does however  report compliance with HEP.  Agree with d/c at this time as pt has no more pain or skilled PT needs.   PT Next Visit Plan d/c PT   Consulted and Agree with Plan of Care Patient        Problem List Patient Active Problem List   Diagnosis Date Noted  . Muscle pain, cervical 05/19/2015  . Burning in the chest 05/19/2015  . Normocytic anemia 06/27/2012  . Multiple joint pain 06/24/2012   Laureen Abrahams, PT, DPT 06/30/2015 3:51 PM  [facility] [address] [place], [place], [address] Phone: [phone]   Fax:  [phone]  Name: Andrea Bird MRN: [id number] Date of Birth: [birth year]     PHYSICAL THERAPY DISCHARGE SUMMARY  Visits from Start of Care: 3  Current functional level related to goals / functional outcomes: See above   Remaining deficits: N/a; pt without pain and able to perform job  duties without pain   Education / Equipment: HEP, posture/body mechanics  Plan: Patient agrees to discharge.  Patient goals were partially met. Patient is being discharged due to being pleased with the current functional level.  ?????   Laureen Abrahams, PT, DPT 06/30/2015 3:52 PM  [facility] [address], [place] [address]  [phone] (office) [phone] (fax)

## 2015-06-30 NOTE — Patient Instructions (Signed)
Posture Tips DO: - stand tall and erect - keep chin tucked in - keep head and shoulders in alignment - check posture regularly in mirror or large window - pull head back against headrest in car seat;  Change your position often.  Sit with lumbar support. DON'T: - slouch or slump while watching TV or reading - sit, stand or lie in one position  for too long;  Sitting is especially hard on the spine so if you sit at a desk/use the computer, then stand up often!   Copyright  VHI. All rights reserved.  Posture - Standing   Good posture is important. Avoid slouching and forward head thrust. Maintain curve in low back and align ears over shoul- ders, hips over ankles.  Pull your belly button in toward your back bone.   Copyright  VHI. All rights reserved.  Posture - Sitting   Sit upright, head facing forward. Try using a roll to support lower back. Keep shoulders relaxed, and avoid rounded back. Keep hips level with knees. Avoid crossing legs for long periods.   Copyright  VHI. All rights reserved.    Sleeping on Back  Place pillow under knees. A pillow with cervical support and a roll around waist are also helpful. Copyright  VHI. All rights reserved.  Sleeping on Side Place pillow between knees. Use cervical support under neck and a roll around waist as needed. Copyright  VHI. All rights reserved.   Sleeping on Stomach   If this is the only desirable sleeping position, place pillow under lower legs, and under stomach or chest as needed.  Posture - Sitting   Sit upright, head facing forward. Try using a roll to support lower back. Keep shoulders relaxed, and avoid rounded back. Keep hips level with knees. Avoid crossing legs for long periods. Stand to Sit / Sit to Stand   To sit: Bend knees to lower self onto front edge of chair, then scoot back on seat. To stand: Reverse sequence by placing one foot forward, and scoot to front of seat. Use rocking motion to stand up.    Work Height and Reach  Ideal work height is no more than 2 to 4 inches below elbow level when standing, and at elbow level when sitting. Reaching should be limited to arm's length, with elbows slightly bent.  Bending  Bend at hips and knees, not back. Keep feet shoulder-width apart.    Posture - Standing   Good posture is important. Avoid slouching and forward head thrust. Maintain curve in low back and align ears over shoul- ders, hips over ankles.  Alternating Positions   Alternate tasks and change positions frequently to reduce fatigue and muscle tension. Take rest breaks. Computer Work   Position work to face forward. Use proper work and seat height. Keep shoulders back and down, wrists straight, and elbows at right angles. Use chair that provides full back support. Add footrest and lumbar roll as needed.  Getting Into / Out of Car  Lower self onto seat, scoot back, then bring in one leg at a time. Reverse sequence to get out.  Dressing  Lie on back to pull socks or slacks over feet, or sit and bend leg while keeping back straight.    Housework - Sink  Place one foot on ledge of cabinet under sink when standing at sink for prolonged periods.   Pushing / Pulling  Pushing is preferable to pulling. Keep back in proper alignment, and use leg muscles to   do the work.  Deep Squat   Squat and lift with both arms held against upper trunk. Tighten stomach muscles without holding breath. Use smooth movements to avoid jerking.  Avoid Twisting   Avoid twisting or bending back. Pivot around using foot movements, and bend at knees if needed when reaching for articles.  Carrying Luggage   Distribute weight evenly on both sides. Use a cart whenever possible. Do not twist trunk. Move body as a unit.   Lifting Principles .Maintain proper posture and head alignment. .Slide object as close as possible before lifting. .Move obstacles out of the way. .Test before  lifting; ask for help if too heavy. .Tighten stomach muscles without holding breath. .Use smooth movements; do not jerk. .Use legs to do the work, and pivot with feet. .Distribute the work load symmetrically and close to the center of trunk. .Push instead of pull whenever possible.   Ask For Help   Ask for help and delegate to others when possible. Coordinate your movements when lifting together, and maintain the low back curve.  Log Roll   Lying on back, bend left knee and place left arm across chest. Roll all in one movement to the right. Reverse to roll to the left. Always move as one unit. Housework - Sweeping  Use long-handled equipment to avoid stooping.   Housework - Wiping  Position yourself as close as possible to reach work surface. Avoid straining your back.  Laundry - Unloading Wash   To unload small items at bottom of washer, lift leg opposite to arm being used to reach.  Gardening - Raking  Move close to area to be raked. Use arm movements to do the work. Keep back straight and avoid twisting.     Cart  When reaching into cart with one arm, lift opposite leg to keep back straight.   Getting Into / Out of Bed  Lower self to lie down on one side by raising legs and lowering head at the same time. Use arms to assist moving without twisting. Bend both knees to roll onto back if desired. To sit up, start from lying on side, and use same move-ments in reverse. Housework - Vacuuming  Hold the vacuum with arm held at side. Step back and forth to move it, keeping head up. Avoid twisting.   Laundry - Loading Wash  Position laundry basket so that bending and twisting can be avoided.   Laundry - Unloading Dryer  Squat down to reach into clothes dryer or use a reacher.  Gardening - Weeding / Planting  Squat or Kneel. Knee pads may be helpful.                    

## 2016-05-08 DIAGNOSIS — Z114 Encounter for screening for human immunodeficiency virus [HIV]: Secondary | ICD-10-CM | POA: Diagnosis not present

## 2016-05-08 DIAGNOSIS — E559 Vitamin D deficiency, unspecified: Secondary | ICD-10-CM | POA: Diagnosis not present

## 2016-05-08 DIAGNOSIS — Z0000 Encounter for general adult medical examination without abnormal findings: Secondary | ICD-10-CM | POA: Diagnosis not present

## 2016-05-08 DIAGNOSIS — M542 Cervicalgia: Secondary | ICD-10-CM | POA: Diagnosis not present

## 2016-05-08 DIAGNOSIS — R5383 Other fatigue: Secondary | ICD-10-CM | POA: Diagnosis not present

## 2016-05-08 MED FILL — MELOXICAM 15 MG TABLET: 15 | 15 days supply | Qty: 15 | Fill #0

## 2016-05-31 DIAGNOSIS — M542 Cervicalgia: Secondary | ICD-10-CM | POA: Diagnosis not present

## 2016-05-31 DIAGNOSIS — E782 Mixed hyperlipidemia: Secondary | ICD-10-CM | POA: Diagnosis not present

## 2016-05-31 DIAGNOSIS — E559 Vitamin D deficiency, unspecified: Secondary | ICD-10-CM | POA: Diagnosis not present

## 2016-05-31 MED FILL — MELOXICAM 15 MG TABLET: 15 | 30 days supply | Qty: 30 | Fill #0

## 2016-06-14 DIAGNOSIS — Y990 Civilian activity done for income or pay: Secondary | ICD-10-CM | POA: Diagnosis not present

## 2016-06-14 DIAGNOSIS — M545 Low back pain: Secondary | ICD-10-CM | POA: Diagnosis not present

## 2016-06-14 MED FILL — predniSONE 50 MG TABS: 50 | 5 days supply | Qty: 5 | Fill #0

## 2016-12-04 ENCOUNTER — Encounter: Payer: Self-pay | Admitting: Medical

## 2016-12-04 ENCOUNTER — Ambulatory Visit (INDEPENDENT_AMBULATORY_CARE_PROVIDER_SITE_OTHER): Payer: 59 | Admitting: Medical

## 2016-12-04 VITALS — BP 114/78 | HR 71 | Ht 64.0 in | Wt 212.4 lb

## 2016-12-04 DIAGNOSIS — Z131 Encounter for screening for diabetes mellitus: Secondary | ICD-10-CM | POA: Diagnosis not present

## 2016-12-04 DIAGNOSIS — L0292 Furuncle, unspecified: Secondary | ICD-10-CM | POA: Diagnosis not present

## 2016-12-04 DIAGNOSIS — L989 Disorder of the skin and subcutaneous tissue, unspecified: Secondary | ICD-10-CM | POA: Diagnosis not present

## 2016-12-04 LAB — BASIC METABOLIC PANEL
BUN: 15 mg/dL (ref 7–25)
CALCIUM: 9.5 mg/dL (ref 8.6–10.2)
CO2: 23 mmol/L (ref 20–31)
CREATININE: 0.72 mg/dL (ref 0.50–1.10)
Chloride: 104 mmol/L (ref 98–110)
GLUCOSE: 96 mg/dL (ref 65–99)
Potassium: 4.5 mmol/L (ref 3.5–5.3)
Sodium: 136 mmol/L (ref 135–146)

## 2016-12-04 LAB — CBC
HCT: 35.9 % (ref 35.0–45.0)
Hemoglobin: 11.6 g/dL — ABNORMAL LOW (ref 11.7–15.5)
MCH: 28.3 pg (ref 27.0–33.0)
MCHC: 32.3 g/dL (ref 32.0–36.0)
MCV: 87.6 fL (ref 80.0–100.0)
MPV: 9.7 fL (ref 7.5–12.5)
Platelets: 269 10*3/uL (ref 140–400)
RBC: 4.1 MIL/uL (ref 3.80–5.10)
RDW: 13.8 % (ref 11.0–15.0)
WBC: 5.5 10*3/uL (ref 4.0–10.5)

## 2016-12-04 MED ORDER — MUPIROCIN 2 % EX OINT: 1.0000 "application " | TOPICAL_OINTMENT | Freq: Three times a day (TID) | CUTANEOUS | 0 refills | Status: DC

## 2016-12-04 MED ORDER — CEPHALEXIN 500 MG PO CAPS: 500.0000 mg | ORAL_CAPSULE | Freq: Three times a day (TID) | ORAL | 0 refills | Status: DC

## 2016-12-04 MED FILL — CEPHALEXIN 500 MG CAPSULE: 500 | 10 days supply | Qty: 30 | Fill #0

## 2016-12-04 MED FILL — MUPIROCIN 2% OINTMENT: 2 | 14 days supply | Qty: 22 | Fill #0

## 2016-12-04 NOTE — Patient Instructions (Signed)
Encounter Diagnoses  Name Primary?  . Furuncle Yes  . Skin lesion   . Screening for diabetes mellitus    Recommendations  We will check labs today to screen for diabetes or other reason for skin infection  Begin warm compresses, hot/moist rag to the leg lesion twice daily for 20 minutes each for the next several days  Begin mupirocin ointment 3 times daily for a week  Begin oral antibiotic Keflex 3 times daily for 10 days    Skin Abscess A skin abscess is an infected area on or under your skin that contains pus and other material. An abscess can happen almost anywhere on your body. Some abscesses break open (rupture) on their own. Most continue to get worse unless they are treated. The infection can spread deeper into the body and into your blood, which can make you feel sick. Treatment usually involves draining the abscess. Follow these instructions at home: Abscess Care  If you have an abscess that has not drained, place a warm, clean, wet washcloth over the abscess several times a day. Do this as told by your doctor.  Follow instructions from your doctor about how to take care of your abscess. Make sure you: ? Cover the abscess with a bandage (dressing). ? Change your bandage or gauze as told by your doctor. ? Wash your hands with soap and water before you change the bandage or gauze. If you cannot use soap and water, use hand sanitizer.  Check your abscess every day for signs that the infection is getting worse. Check for: ? More redness, swelling, or pain. ? More fluid or blood. ? Warmth. ? More pus or a bad smell. Medicines   Take over-the-counter and prescription medicines only as told by your doctor.  If you were prescribed an antibiotic medicine, take it as told by your doctor. Do not stop taking the antibiotic even if you start to feel better. General instructions  To avoid spreading the infection: ? Do not share personal care items, towels, or hot tubs with  others. ? Avoid making skin-to-skin contact with other people.  Keep all follow-up visits as told by your doctor. This is important. Contact a doctor if:  You have more redness, swelling, or pain around your abscess.  You have more fluid or blood coming from your abscess.  Your abscess feels warm when you touch it.  You have more pus or a bad smell coming from your abscess.  You have a fever.  Your muscles ache.  You have chills.  You feel sick. Get help right away if:  You have very bad (severe) pain.  You see red streaks on your skin spreading away from the abscess. This information is not intended to replace advice given to you by your health care provider. Make sure you discuss any questions you have with your health care provider. Document Released: 11/15/2007 Document Revised: 01/23/2016 Document Reviewed: 04/07/2015 Elsevier Interactive Patient Education  [employer]2018 Elsevier Inc.

## 2016-12-04 NOTE — Progress Notes (Signed)
Subjective: Chief Complaint  Patient presents with  . new pt    bump or knot on her inner thigh  ,that comes and goes,   Here to establish care.  Here with husband as a new patient today.   Speaks english and french.  hasn't really had primary care in recent past.  Has some bumps, will stay for a few days.  Been getting these on right upper inner thigh.   They will swell, be painful, but will get smaller as well.  Just started getting these within the past week or 2.   No prior similar.   Some white to clear drainage, no fever.  No hx/o diabetes or other underlying illness.  Has used some ibuprofen and tylenol for pain.  No other aggravating or relieving factors. No other complaint.   No past medical history on file.   Current Outpatient Prescriptions on File Prior to Visit  Medication Sig Dispense Refill  . ibuprofen (ADVIL,MOTRIN) 800 MG tablet Take 800 mg by mouth 3 (three) times daily.     No current facility-administered medications on file prior to visit.    ROS as in subjective  Objective: BP 114/78   Pulse 71   Ht 5\' 4"  (1.626 m)   Wt 212 lb 6.4 oz (96.3 kg)   SpO2 99%   BMI 36.46 kg/m   Gen: wd, wn, nad, obese AA female Skin: right upper inner thigh with 1.5 cm round slightly raised, slightly tender lesion, likely recent boil.   No induration, redness or drainage.  No fluctuance or warmth Exam chaperoned by nurse   Assessment: Encounter Diagnoses  Name Primary?  . Furuncle Yes  . Skin lesion   . Screening for diabetes mellitus      Plan: Discussed findings.   recommendations below, lab screening today.  Recommendations  We will check labs today to screen for diabetes or other reason for skin infection  Begin warm compresses, hot/moist rag to the leg lesion twice daily for 20 minutes each for the next several days  Begin mupirocin ointment 3 times daily for a week  Begin oral antibiotic Keflex 3 times daily for 10 days   Theodoro Koskouvi was seen today for new  pt.  Diagnoses and all orders for this visit:  Furuncle -     cephALEXin (KEFLEX) 500 MG capsule; Take 1 capsule (500 mg total) by mouth 3 (three) times daily. -     mupirocin ointment (BACTROBAN) 2 %; Apply 1 application topically 3 (three) times daily. -     Hemoglobin A1c -     CBC -     Basic metabolic panel  Skin lesion -     cephALEXin (KEFLEX) 500 MG capsule; Take 1 capsule (500 mg total) by mouth 3 (three) times daily. -     mupirocin ointment (BACTROBAN) 2 %; Apply 1 application topically 3 (three) times daily. -     Hemoglobin A1c -     CBC -     Basic metabolic panel  Screening for diabetes mellitus -     cephALEXin (KEFLEX) 500 MG capsule; Take 1 capsule (500 mg total) by mouth 3 (three) times daily. -     mupirocin ointment (BACTROBAN) 2 %; Apply 1 application topically 3 (three) times daily. -     Hemoglobin A1c -     CBC -     Basic metabolic panel

## 2016-12-05 LAB — HEMOGLOBIN A1C
Hgb A1c MFr Bld: 5.7 % — ABNORMAL HIGH (ref <5.7)
Mean Plasma Glucose: 117 mg/dL

## 2018-04-17 ENCOUNTER — Encounter: Payer: Self-pay | Admitting: Family

## 2018-04-17 ENCOUNTER — Other Ambulatory Visit (INDEPENDENT_AMBULATORY_CARE_PROVIDER_SITE_OTHER): Payer: 59

## 2018-04-17 ENCOUNTER — Ambulatory Visit (INDEPENDENT_AMBULATORY_CARE_PROVIDER_SITE_OTHER)
Admission: RE | Admit: 2018-04-17 | Discharge: 2018-04-17 | Disposition: A | Discharge status: Home / Self Care | Payer: 59 | Source: Ambulatory Visit | Attending: Family | Admitting: Family

## 2018-04-17 ENCOUNTER — Ambulatory Visit: Payer: 59 | Admitting: Family

## 2018-04-17 VITALS — BP 126/78 | HR 61 | Temp 97.9°F | Ht 64.0 in | Wt 205.0 lb

## 2018-04-17 DIAGNOSIS — G8929 Other chronic pain: Secondary | ICD-10-CM | POA: Diagnosis not present

## 2018-04-17 DIAGNOSIS — M25562 Pain in left knee: Secondary | ICD-10-CM | POA: Diagnosis not present

## 2018-04-17 DIAGNOSIS — M549 Dorsalgia, unspecified: Secondary | ICD-10-CM

## 2018-04-17 DIAGNOSIS — M25561 Pain in right knee: Secondary | ICD-10-CM

## 2018-04-17 DIAGNOSIS — M791 Myalgia, unspecified site: Secondary | ICD-10-CM | POA: Diagnosis not present

## 2018-04-17 DIAGNOSIS — M545 Low back pain: Secondary | ICD-10-CM | POA: Diagnosis not present

## 2018-04-17 DIAGNOSIS — M170 Bilateral primary osteoarthritis of knee: Secondary | ICD-10-CM | POA: Diagnosis not present

## 2018-04-17 LAB — VITAMIN D 25 HYDROXY (VIT D DEFICIENCY, FRACTURES): VITD: 19.7 ng/mL — ABNORMAL LOW (ref 30.00–100.00)

## 2018-04-17 LAB — COMPREHENSIVE METABOLIC PANEL
ALT: 19 U/L (ref 0–35)
AST: 20 U/L (ref 0–37)
Albumin: 4.2 g/dL (ref 3.5–5.2)
Alkaline Phosphatase: 54 U/L (ref 39–117)
BILIRUBIN TOTAL: 0.3 mg/dL (ref 0.2–1.2)
BUN: 15 mg/dL (ref 6–23)
CALCIUM: 9.1 mg/dL (ref 8.4–10.5)
CO2: 26 mEq/L (ref 19–32)
CREATININE: 0.72 mg/dL (ref 0.40–1.20)
Chloride: 106 mEq/L (ref 96–112)
GFR: 115.21 mL/min (ref >60.00)
Glucose, Bld: 90 mg/dL (ref 70–99)
Potassium: 3.7 mEq/L (ref 3.5–5.1)
Sodium: 137 mEq/L (ref 135–145)
Total Protein: 7.4 g/dL (ref 6.0–8.3)

## 2018-04-17 LAB — CBC WITH DIFFERENTIAL/PLATELET
BASOS ABS: 0 10*3/uL (ref 0.0–0.1)
Basophils Relative: 0.4 % (ref 0.0–3.0)
EOS ABS: 0.1 10*3/uL (ref 0.0–0.7)
Eosinophils Relative: 2 % (ref 0.0–5.0)
HEMATOCRIT: 35.5 % — AB (ref 36.0–46.0)
HEMOGLOBIN: 11.9 g/dL — AB (ref 12.0–15.0)
LYMPHS PCT: 43.7 % (ref 12.0–46.0)
Lymphs Abs: 2 10*3/uL (ref 0.7–4.0)
MCHC: 33.5 g/dL (ref 30.0–36.0)
MCV: 86.7 fl (ref 78.0–100.0)
Monocytes Absolute: 0.5 10*3/uL (ref 0.1–1.0)
Monocytes Relative: 10 % (ref 3.0–12.0)
NEUTROS ABS: 2 10*3/uL (ref 1.4–7.7)
Neutrophils Relative %: 43.9 % (ref 43.0–77.0)
PLATELETS: 238 10*3/uL (ref 150.0–400.0)
RBC: 4.09 Mil/uL (ref 3.87–5.11)
RDW: 13.6 % (ref 11.5–15.5)
WBC: 4.7 10*3/uL (ref 4.0–10.5)

## 2018-04-17 LAB — SEDIMENTATION RATE: Sed Rate: 18 mm/hr (ref 0–20)

## 2018-04-17 LAB — TSH: TSH: 1.22 u[IU]/mL (ref 0.35–4.50)

## 2018-04-17 LAB — VITAMIN B12: Vitamin B-12: 408 pg/mL (ref 211–911)

## 2018-04-17 NOTE — Progress Notes (Signed)
Andrea Bird is a 42 y.o. female with the following history as recorded in [facility]:  Patient Active Problem List   Diagnosis Date Noted  . Muscle pain, cervical 05/19/2015  . Burning in the chest 05/19/2015  . Normocytic anemia 06/27/2012  . Multiple joint pain 06/24/2012    Current Outpatient Medications  Medication Sig Dispense Refill  . ibuprofen (ADVIL,MOTRIN) 800 MG tablet Take 800 mg by mouth 3 (three) times daily.     No current facility-administered medications for this visit.     Allergies: Patient has no known allergies.  History reviewed. No pertinent past medical history.  History reviewed. No pertinent surgical history.  Family History  Problem Relation Age of Onset  . Healthy Mother   . Healthy Father     Social History   Tobacco Use  . Smoking status: Never Smoker  . Smokeless tobacco: Never Used  Substance Use Topics  . Alcohol use: No    Subjective:  Patient presents with concerns for pain in both of thighs/ bilateral knee pain x 3 months; son is with her to help translate/ patient can speak English but feels son does a better job translating; no recent injury or trauma; does get some relief with OTC Ben-Gay; no muscle cramps; has been seen with similar complaints of joint pains in 2014 and 2016/ symptoms then seemed to resolve until 3 months ago; has  been to rheumatology in 2016? ? 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  Head: Normocephalic and atraumatic  Eyes: Sclera and  conjunctiva clear; pupils round and reactive to light; extraocular movements intact  Ears: External normal; canals clear; tympanic membranes normal  Oropharynx: Pink, supple. No suspicious lesions  Neck: Supple without thyromegaly, adenopathy  Lungs: Respirations unlabored; clear to auscultation bilaterally without wheeze, rales, rhonchi  CVS exam: normal rate and regular rhythm.  Musculoskeletal: No deformities; no active joint inflammation  Extremities: No edema, cyanosis, clubbing  Vessels: Symmetric bilaterally  Neurologic: Alert and oriented; speech intact; face symmetrical; moves all extremities well; CNII-XII intact without focal deficit   Assessment:  1. Myalgia   2. Chronic bilateral back pain, unspecified back location   3. Chronic pain of both knees     Plan:  ? Fibromyalgia based on rheumatology evaluation in 2016; will repeat labs and X-rays today; assuming, normal, may want to consider trial of Cymbalta follow-up to be determined. Patient is also encouraged to schedule for CPE including pap smear; will need to address mammogram at that time as well.    No follow-ups on file.  Orders Placed This Encounter  Procedures  . DG Lumbar Spine Complete    Standing Status:   Future    Number of Occurrences:   1    Standing Expiration Date:   06/18/2019    Order Specific Question:   Reason for Exam (SYMPTOM  OR DIAGNOSIS REQUIRED)    Answer:   back pain  Order Specific Question:   Is patient pregnant?    Answer:   No    Order Specific Question:   Preferred imaging location?    Answer:   [facility]    Order Specific Question:   Radiology Contrast Protocol - do NOT remove file path    Answer:   \\[email or web address]\DXFluoroContrastProtocols.pdf  . DG Knee Complete 4 Views Left    Standing Status:   Future    Number of Occurrences:   1    Standing Expiration Date:   06/18/2019    Order Specific Question:   Reason for Exam (SYMPTOM  OR DIAGNOSIS REQUIRED)    Answer:    knee pain    Order Specific Question:   Is patient pregnant?    Answer:   No    Order Specific Question:   Preferred imaging location?    Answer:   [facility]    Order Specific Question:   Radiology Contrast Protocol - do NOT remove file path    Answer:   \\[email or web address]\DXFluoroContrastProtocols.pdf  . DG Knee Complete 4 Views Right    Standing Status:   Future    Number of Occurrences:   1    Standing Expiration Date:   06/18/2019    Order Specific Question:   Reason for Exam (SYMPTOM  OR DIAGNOSIS REQUIRED)    Answer:   knee pain    Order Specific Question:   Is patient pregnant?    Answer:   No    Order Specific Question:   Preferred imaging location?    Answer:   [facility]    Order Specific Question:   Radiology Contrast Protocol - do NOT remove file path    Answer:   \\[email or web address]s.pdf  . CBC w/Diff    Standing Status:   Future    Number of Occurrences:   1    Standing Expiration Date:   04/17/2019  . Comp Met (CMET)    Standing Status:   Future    Number of Occurrences:   1    Standing Expiration Date:   04/17/2019  . Antinuclear Antib (ANA)    Standing Status:   Future    Number of Occurrences:   1    Standing Expiration Date:   04/17/2019  . Sed Rate (ESR)    Standing Status:   Future    Number of Occurrences:   1    Standing Expiration Date:   04/17/2019  . Rheumatoid Factor    Standing Status:   Future    Number of Occurrences:   1    Standing Expiration Date:   04/17/2019  . B12    Standing Status:   Future    Number of Occurrences:   1    Standing Expiration Date:   04/17/2019  . TSH    Standing Status:   Future    Number of Occurrences:   1    Standing Expiration Date:   04/17/2019  . Vitamin D (25 hydroxy)    Standing Status:   Future    Number of Occurrences:   1    Standing Expiration Date:   04/17/2019    Requested Prescriptions    No prescriptions requested or ordered in this encounter

## 2018-04-18 ENCOUNTER — Other Ambulatory Visit: Payer: Self-pay | Admitting: Family

## 2018-04-18 MED ORDER — VITAMIN D (ERGOCALCIFEROL) 1.25 MG (50000 UNIT) PO CAPS: 50000.0000 [IU] | ORAL_CAPSULE | ORAL | 0 refills | Status: AC

## 2018-04-18 MED FILL — VIT D2 1.25 MG (50,000 UNIT: 1.25 MG | 84 days supply | Qty: 12 | Fill #0

## 2018-04-19 ENCOUNTER — Encounter: Payer: Self-pay | Admitting: Family

## 2018-04-19 LAB — RHEUMATOID FACTOR: Rhuematoid fact SerPl-aCnc: <14 IU/mL (ref <14)

## 2018-04-19 LAB — ANA: Anti Nuclear Antibody(ANA): NEGATIVE

## 2018-05-01 DIAGNOSIS — Z1231 Encounter for screening mammogram for malignant neoplasm of breast: Secondary | ICD-10-CM | POA: Diagnosis not present

## 2018-05-01 DIAGNOSIS — Z6835 Body mass index (BMI) 35.0-35.9, adult: Secondary | ICD-10-CM | POA: Diagnosis not present

## 2018-05-01 DIAGNOSIS — Z01419 Encounter for gynecological examination (general) (routine) without abnormal findings: Secondary | ICD-10-CM | POA: Diagnosis not present

## 2018-06-19 ENCOUNTER — Ambulatory Visit: Payer: Self-pay | Admitting: Family Medicine

## 2018-06-26 ENCOUNTER — Encounter: Payer: Self-pay | Admitting: Family Medicine

## 2018-06-26 ENCOUNTER — Ambulatory Visit: Payer: 59 | Admitting: Family Medicine

## 2018-06-26 ENCOUNTER — Encounter

## 2018-06-26 DIAGNOSIS — M222X9 Patellofemoral disorders, unspecified knee: Secondary | ICD-10-CM | POA: Insufficient documentation

## 2018-06-26 DIAGNOSIS — G8929 Other chronic pain: Secondary | ICD-10-CM | POA: Diagnosis not present

## 2018-06-26 DIAGNOSIS — M222X1 Patellofemoral disorders, right knee: Secondary | ICD-10-CM | POA: Diagnosis not present

## 2018-06-26 DIAGNOSIS — M2550 Pain in unspecified joint: Secondary | ICD-10-CM | POA: Diagnosis not present

## 2018-06-26 DIAGNOSIS — M545 Low back pain, unspecified: Secondary | ICD-10-CM | POA: Insufficient documentation

## 2018-06-26 NOTE — Patient Instructions (Signed)
Good to see you.  Ice 20 minutes 2 times daily. Usually after activity and before bed. pennsaid pinkie amount topically 2 times daily as needed.  Continue the vitamin D once a week  Exercises 3 times a week.  Over the counter get  Turmeric 500mg  daily  Tart cherry extract any dose at night See me again in 6 weeks to make sure you are doing better

## 2018-06-26 NOTE — Assessment & Plan Note (Signed)
Patellofemoral Syndrome  Reviewed anatomy using anatomical model and how PFS occurs.  Given rehab exercises handout for VMO, hip abductors, core, entire kinetic chain including proprioception exercises including cone touches, step downs, hip elevations and turn outs.  Could benefit from PT, regular exercise, upright biking, and a PFS knee brace to assist with tracking abnormalities. Return to clinic in 6 weeks

## 2018-06-26 NOTE — Progress Notes (Signed)
[facility] [address] [place], [place] [address] Phone: [phone] Subjective:    I Andrea Bird am serving as a Neurosurgeon for Dr. Antoine Primas.   I'm seeing this patient by the request  of:  Olive Bass, FNP   CC:  Back pain and bilateral knee pain   BSW:[id number]  Andrea Bird is a 43 y.o. female coming in with complaint of back and bilateral knee pain. Pain radiates down to her ankle. No numbness and tingling noted. Knee pain worse in the morning.   Onset- 5 months   Aggravating factors- walking   Therapies tried- Topical Severity- 7/10    Patient had x-rays of the lumbar spine done April 18, 2018.  Independently visualized by me.  Found to have mild facet arthropathy at L4-L5 otherwise fairly unremarkable Patient had bilateral knee x-rays done as well at that time.  They showed very mild medial compartment arthritis otherwise unremarkable laboratory work-up showed that patient did have low vitamin D level and patient was started on once weekly vitamin D.  No past medical history on file. No past surgical history on file. Social History   Socioeconomic History  . Marital status: Married    Spouse name: Not on file  . Number of children: 1  . Years of education: 28  . Highest education level: Not on file  Occupational History  . Occupation: Lobbyist  . Financial resource strain: Not on file  . Food insecurity:    Worry: Not on file    Inability: Not on file  . Transportation needs:    Medical: Not on file    Non-medical: Not on file  Tobacco Use  . Smoking status: Never Smoker  . Smokeless tobacco: Never Used  Substance and Sexual Activity  . Alcohol use: No  . Drug use: No  . Sexual activity: Not on file  Lifestyle  . Physical activity:    Days per week: Not on file    Minutes per session: Not on file  . Stress: Not on file  Relationships  . Social connections:    Talks on phone:  Not on file    Gets together: Not on file    Attends religious service: Not on file    Active member of club or organization: Not on file    Attends meetings of clubs or organizations: Not on file    Relationship status: Not on file  Other Topics Concern  . 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 Healthy Mother   . Healthy Father        Current Outpatient Medications (Analgesics):  .  ibuprofen (ADVIL,MOTRIN) 800 MG tablet, Take 800 mg by mouth 3 (three) times daily.   Current Outpatient Medications (Other):  Marland Kitchen  Vitamin D, Ergocalciferol, (DRISDOL) 1.25 MG (50000 UT) CAPS capsule, Take 1 capsule (50,000 Units total) by mouth every 7 (seven) days for 12 doses.    Past medical history, social, surgical and family history all reviewed in electronic medical record.  No pertanent information unless stated regarding to the chief complaint.   Review of Systems:  No headache, visual changes, nausea, vomiting, diarrhea, constipation, dizziness, abdominal pain, skin rash, fevers, chills, night sweats, weight loss, swollen lymph nodes, body aches, joint swelling, chest pain, shortness of breath,  mood changes. Positive muscle aches   Objective  Blood pressure 108/68, pulse 67, height 5\' 4"  (1.626 m), weight 203 lb (92.1 kg), SpO2 98 %.    General: No apparent distress alert and oriented x3 mood and affect normal, dressed appropriately.  HEENT: Pupils equal, extraocular movements intact  Respiratory: Patient's speak in full sentences and does not appear short of breath  Cardiovascular: No lower extremity edema, non tender, no erythema  Skin: Warm dry intact with no signs of infection or rash on extremities or on axial skeleton.  Abdomen: Soft nontender  Neuro: Cranial nerves II through XII are intact, neurovascularly intact in all extremities  with 2+ DTRs and 2+ pulses.  Lymph: No lymphadenopathy of posterior or anterior cervical chain or axillae bilaterally.  Gait antalgic MSK:  Non tender with full range of motion and good stability and symmetric strength and tone of shoulders, elbows, wrist, hip, and ankles bilaterally.  Back Exam:  Inspection: poor core strength  Motion: Flexion 35 deg, Extension 15 deg, Side Bending to 35 deg bilaterally, Rotation to 35 deg bilaterally  SLR laying: Negative  XSLR laying: Negative  Palpable tenderness: TTP diffusely . FABER: Positive Faber. Sensory change: Gross sensation intact to all lumbar and sacral dermatomes.  Reflexes: 2+ at both patellar tendons, 2+ at achilles tendons, Babinski's downgoing.  Strength at foot  Plantar-flexion: 5/5 Dorsi-flexion: 5/5 Eversion: 5/5 Inversion: 5/5  Leg strength  Quad: 5/5 Hamstring: 5/5 Hip flexor: 5/5 Hip abductors: 5/5  Gait unremarkable.  Knee: Bilateral Normal to inspection with no erythema or effusion or obvious bony abnormalities. Palpation  Diffusely tender . ROM full in flexion and extension and lower leg rotation. Ligaments with solid consistent endpoints including ACL, PCL, LCL, MCL. Negative Mcmurray's, Apley's, and Thessalonian tests. Non painful patellar compression. Patellar glide without crepitus. Patellar and quadriceps tendons unremarkable. Hamstring and quadriceps strength is normal.  [address]; 15 additional minutes spent for Therapeutic exercises as stated in above notes.  This included exercises focusing on stretching, strengthening, with significant focus on eccentric aspects.   Long term goals include an improvement in range of motion, strength, endurance as well as avoiding reinjury. Patient's frequency would include in 1-2 times a day, 3-5 times a week for a duration of 6-12 weeks. Low back exercises that included:  Pelvic tilt/bracing instruction to focus on control of the pelvic girdle and lower abdominal muscles  Glute  strengthening exercises, focusing on proper firing of the glutes without engaging the low back muscles Proper stretching techniques for maximum relief for the hamstrings, hip flexors, low back and some rotation where tolerated    Proper technique shown and discussed handout in great detail with ATC.  All questions were discussed and answered.     Impression and Recommendations:     This case required medical decision making of moderate complexity. The above documentation has been reviewed and is accurate and complete Judi Saa, DO       Note: This dictation was prepared with Dragon dictation along with smaller phrase technology. Any transcriptional errors that result from this process are unintentional.

## 2018-06-26 NOTE — Assessment & Plan Note (Signed)
Patient continues to have multiple joint pain.  Has been attempting to go to the gym now and is able to workout fairly regularly.  We discussed different position, proper lifting, work with athletic trainer to learn home exercises in greater detail.  Patient wants to avoid prescription medications and we discussed over-the-counter medications.  This is all done through an interpreter and hopefully was communicated appropriately.  Patient's x-rays are fairly unremarkable at this time and do not feel that advanced imaging would be warranted but possible need for formal physical therapy in the future.  Follow-up again in 6 weeks

## 2018-06-26 NOTE — Assessment & Plan Note (Signed)
Intermittent overall.  X-rays do show that mild facet arthropathy with L4-L5 but otherwise fairly unremarkable.  Discussed posture ergonomics poor core strengthening.  Home exercises given and work with Event organiser.  Follow-up again in 4 to 8 weeks

## 2018-07-29 DIAGNOSIS — Z319 Encounter for procreative management, unspecified: Secondary | ICD-10-CM | POA: Diagnosis not present

## 2018-07-29 DIAGNOSIS — E2839 Other primary ovarian failure: Secondary | ICD-10-CM | POA: Diagnosis not present

## 2018-07-30 MED FILL — OVIDREL 250 MCG/0.5 ML SYRG: 250 | 1 days supply | Qty: 1 | Fill #0

## 2018-07-30 MED FILL — LETROZOLE 2.5 MG TABS: 2.5 | 15 days supply | Qty: 15 | Fill #0

## 2018-08-06 NOTE — Progress Notes (Signed)
[facility] [address] [place], [place] [address] Phone: [phone] Subjective:      CC: Knee pain and back pain follow-up  MAU:[id number]   I, Wilford Grist, am serving as a scribe for Dr. Antoine Primas.  06/26/2018: Intermittent overall.  X-rays do show that mild facet arthropathy with L4-L5 but otherwise fairly unremarkable.  Discussed posture ergonomics poor core strengthening.  Home exercises given and work with Event organiser.    08/07/2018: Andrea Bird is a 43 y.o. female coming in with complaint of back and knee pain. Patient states that she has good days and bad days with both her back and her knees. Has tried HEP but not sure if the stretches help.  Patient states that he can have pain intermittently.  Does not know what seems to aggravate it.  Had known mild facet arthropathy of the back as well as mild patellofemoral arthritis of the knees     No past medical history on file. No past surgical history on file. Social History   Socioeconomic History  . Marital status: Married    Spouse name: Not on file  . Number of children: 1  . Years of education: 57  . Highest education level: Not on file  Occupational History  . Occupation: Lobbyist  . Financial resource strain: Not on file  . Food insecurity:    Worry: Not on file    Inability: Not on file  . Transportation needs:    Medical: Not on file    Non-medical: Not on file  Tobacco Use  . Smoking status: Never Smoker  . Smokeless tobacco: Never Used  Substance and Sexual Activity  . Alcohol use: No  . Drug use: No  . Sexual activity: Not on file  Lifestyle  . Physical activity:    Days per week: Not on file    Minutes per session: Not on file  . Stress: Not on file  Relationships  . Social connections:    Talks on phone: Not on file    Gets together: Not on file    Attends religious service: Not on file    Active member of club or  organization: Not on file    Attends meetings of clubs or organizations: Not on file    Relationship status: Not on file  Other Topics Concern  . 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 Healthy Mother   . Healthy Father        Current Outpatient Medications (Analgesics):  .  ibuprofen (ADVIL,MOTRIN) 800 MG tablet, Take 800 mg by mouth 3 (three) times daily.   Current Outpatient Medications (Other):  Marland Kitchen  Vitamin D, Ergocalciferol, (DRISDOL) 1.25 MG (50000 UT) CAPS capsule, Take 1 capsule (50,000 Units total) by mouth every 7 (seven) days.    Past medical history, social, surgical and family history all reviewed in electronic medical record.  No pertanent information unless stated regarding to the chief complaint.   Review of Systems:  No headache, visual changes, nausea, vomiting, diarrhea, constipation, dizziness, abdominal pain, skin rash, fevers, chills, night sweats, weight loss, swollen lymph nodes, body aches, joint swelling, , chest pain, shortness of breath, mood changes.  Positive muscle aches  Objective  Blood pressure 122/80, pulse 63, height 5\' 4"  (1.626 m), weight 203 lb (  92.1 kg), SpO2 98 %.    General: No apparent distress alert and oriented x3 mood and affect normal, dressed appropriately.  HEENT: Pupils equal, extraocular movements intact  Respiratory: Patient's speak in full sentences and does not appear short of breath  Cardiovascular: No lower extremity edema, non tender, no erythema  Skin: Warm dry intact with no signs of infection or rash on extremities or on axial skeleton.  Abdomen: Soft nontender  Neuro: Cranial nerves II through XII are intact, neurovascularly intact in all extremities with 2+ DTRs and 2+ pulses.  Lymph: No lymphadenopathy of posterior or anterior cervical chain or axillae bilaterally.  Gait  mild antalgic with externally rotated hips MSK:  Non tender with full range of motion and good stability and symmetric strength and tone of shoulders, elbows, wrist, hip, and ankles bilaterally.  Bilateral knee exam show that patient does have still some grinding of the patellofemoral but does have full range of motion.  Mild diffuse tenderness noted but no significant swelling.  No significant instability noted.  Back exam does have poor core strength.  Patient does have mild loss of lordosis.  Tenderness to palpation diffusely of the lumbar spine.  Negative straight leg test but tightness of the hamstrings.  4+ out of 5 strength in lower extremities bilaterally.  Deep tendon reflexes intact    Impression and Recommendations:     This case required medical decision making of moderate complexity. The above documentation has been reviewed and is accurate and complete Judi Saa, DO       Note: This dictation was prepared with Dragon dictation along with smaller phrase technology. Any transcriptional errors that result from this process are unintentional.

## 2018-08-07 ENCOUNTER — Ambulatory Visit: Payer: 59 | Admitting: Family Medicine

## 2018-08-07 VITALS — BP 122/80 | HR 63 | Ht 64.0 in | Wt 203.0 lb

## 2018-08-07 DIAGNOSIS — M545 Low back pain, unspecified: Secondary | ICD-10-CM

## 2018-08-07 DIAGNOSIS — M222X1 Patellofemoral disorders, right knee: Secondary | ICD-10-CM

## 2018-08-07 DIAGNOSIS — M25561 Pain in right knee: Secondary | ICD-10-CM | POA: Diagnosis not present

## 2018-08-07 MED ORDER — VITAMIN D (ERGOCALCIFEROL) 1.25 MG (50000 UNIT) PO CAPS: 50000.0000 [IU] | ORAL_CAPSULE | ORAL | 0 refills | Status: DC

## 2018-08-07 MED FILL — VIT D2 1.25 MG (50,000 UNIT: 1.25 MG | 84 days supply | Qty: 12 | Fill #0

## 2018-08-07 NOTE — Assessment & Plan Note (Signed)
Multifactorial with patient having muscle weakness as well as facet arthropathy.  Discussed with patient in great length about home exercise, icing regimen, sent to physical therapy.  No significant radicular symptoms and do not feel that advanced imaging is warranted yet.  Follow-up again in 6 weeks

## 2018-08-07 NOTE — Assessment & Plan Note (Signed)
Patellofemoral syndrome bilaterally.  Discussed with patient about which activities to do which was to avoid.  Increase activity slowly over the course of next several days.  Patient is to increase it and monitor.  Follow-up again after physical therapy for 4 to 6 weeks

## 2018-08-07 NOTE — Patient Instructions (Addendum)
Good to see you  Ice is your friend PT will be calling you and I think it will be helpful to try  Once weekly vitamin D again for another 12 weeks and maybe we will check labs next time.  Try to stay active See me again in 6 weeks and still if not better we will try injection in the knees

## 2018-08-12 DIAGNOSIS — E2839 Other primary ovarian failure: Secondary | ICD-10-CM | POA: Diagnosis not present

## 2018-08-12 DIAGNOSIS — Z319 Encounter for procreative management, unspecified: Secondary | ICD-10-CM | POA: Diagnosis not present

## 2018-08-19 DIAGNOSIS — Z319 Encounter for procreative management, unspecified: Secondary | ICD-10-CM | POA: Diagnosis not present

## 2018-08-21 ENCOUNTER — Ambulatory Visit: Payer: 59

## 2018-08-26 ENCOUNTER — Ambulatory Visit: Payer: 59 | Attending: Family Medicine | Admitting: Physical Therapy

## 2018-08-26 ENCOUNTER — Encounter: Payer: Self-pay | Admitting: Physical Therapy

## 2018-08-26 ENCOUNTER — Other Ambulatory Visit: Payer: Self-pay

## 2018-08-26 DIAGNOSIS — M6281 Muscle weakness (generalized): Secondary | ICD-10-CM | POA: Diagnosis not present

## 2018-08-26 DIAGNOSIS — G8929 Other chronic pain: Secondary | ICD-10-CM

## 2018-08-26 DIAGNOSIS — R2689 Other abnormalities of gait and mobility: Secondary | ICD-10-CM | POA: Diagnosis not present

## 2018-08-26 DIAGNOSIS — Z319 Encounter for procreative management, unspecified: Secondary | ICD-10-CM | POA: Diagnosis not present

## 2018-08-26 DIAGNOSIS — M25561 Pain in right knee: Secondary | ICD-10-CM | POA: Diagnosis not present

## 2018-08-26 NOTE — Therapy (Addendum)
[place], [place], [address] Phone: [phone]   Fax:  [phone]  Physical Therapy Evaluation / Discharge  Patient Details  Name: Lataria Courser MRN: [id number] Date of Birth: [birth year] Referring Provider (PT): Lyndal Pulley, DO   Encounter Date: 08/26/2018  PT End of Session - 08/26/18 0806    Visit Number  1    Number of Visits  13    Date for PT Re-Evaluation  10/07/18    PT Start Time  0803    PT Stop Time  0840    PT Time Calculation (min)  37 min    Activity Tolerance  Patient tolerated treatment well    Behavior During Therapy  [facility] for tasks assessed/performed       History reviewed. No pertinent past medical history.  History reviewed. No pertinent surgical history.  There were no vitals filed for this visit.   Subjective Assessment - 08/26/18 0814    Subjective  pt 43 y.o F with CC of R knee pain that has been going onfor 6 months with no specific MOI. She present with a referral for the low back and R knee. Since onset the pain in the R knee has improved. pain stays mostly in the knee and denies N/T.     How long can you sit comfortably?  unlimited    How long can you stand comfortably?  unlimited    How long can you walk comfortably?  unlimited    Diagnostic tests  04/17/2018 X-ray: impression Mild medial compartment degenerative changes bilaterally    Patient Stated Goals  decrease pain, return to everyday tasks    Currently in Pain?  Yes    Pain Score  5    at worst pain gets up to a 5/10   Pain Location  Knee    Pain Orientation  Right    Pain Descriptors / Indicators  Sore    Pain Type  Chronic pain    Pain Onset  More than a month ago    Pain Frequency  Intermittent    Aggravating Factors   walking    Pain Relieving Factors  exercise, medication          [facility] PT Assessment - 08/26/18 0807      Assessment   Medical Diagnosis  Patellofemoral arthralgia of right  knee,  Low back pain, unspecified back pain laterality, unspecified chronicity, unspecified whether sciatica present    Referring Provider (PT)  Lyndal Pulley, DO    Onset Date/Surgical Date  --   6 months   Hand Dominance  Right    Next MD Visit  10/02/2018    Prior Therapy  yes      Precautions   Precautions  None      Restrictions   Weight Bearing Restrictions  No      Balance Screen   Has the patient fallen in the past 6 months  No    Has the patient had a decrease in activity level because of a fear of falling?   No    Is the patient reluctant to leave their home because of a fear of falling?   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[employer] Requirements  prolonged standing/ walking, pushing/ pulling lifting    Leisure  play games      Cognition   Overall Cognitive Status  Within Functional Limits for tasks assessed      Observation/Other Assessments   Focus on Therapeutic Outcomes (FOTO)   34% limited   predicted 26% limited     ROM / Strength   AROM / PROM / Strength  AROM;PROM;Strength      AROM   AROM Assessment Site  Lumbar;Knee    Right/Left Knee  Right;Left    Right Knee Extension  0    Right Knee Flexion  132    Left Knee Extension  0    Left Knee Flexion  126      PROM   PROM Assessment Site  Knee    Right/Left Knee  Right      Strength   Strength Assessment Site  Knee;Hip    Right/Left Hip  Right;Left    Right Hip Flexion  4+/5    Right Hip ABduction  4/5    Right Hip ADduction  4/5    Left Hip Flexion  4+/5    Left Hip ABduction  4/5    Left Hip ADduction  4/5     Right/Left Knee  Right;Left    Right Knee Flexion  4+/5    Right Knee Extension  4+/5    Left Knee Flexion  4+/5    Left Knee Extension  4+/5      Palpation   Patella mobility  increased lateral glide with quad activation on the R compared nil    Palpation comment  TTP noted along the lateral pole of the R patella      Ambulation/Gait   Ambulation/Gait  Yes    Gait Pattern  Step-through pattern;Antalgic                Objective measurements completed on examination: See above findings.      [facility] Adult PT Treatment/Exercise - 08/26/18 0807      Knee/Hip Exercises: Stretches   Hip Flexor Stretch  2 reps;30 seconds;Right      Knee/Hip Exercises: Supine   Quad Sets  1 set;10 reps;Both;Strengthening   with pillow squeeze for VMO activation   Bridges  1 set;10 reps;Both;Strengthening   with ball squeeze     Knee/Hip Exercises: Sidelying   Hip ABduction  2 sets;10 reps;Strengthening;Right             PT Education - 08/26/18 0839    Education Details  evaluation findings, POC, goals, HEP with proper form/ rationale. beginning of [place] survey education.    Person(s) Educated  Patient    Methods  Explanation;Verbal cues;Handout    Comprehension  Verbalized understanding;Verbal cues required       PT Short Term Goals - 08/26/18 0846      PT SHORT TERM GOAL #1   Title  pt to be I with inital HEP     Time  3    Period  Weeks    Status  New    Target Date  09/16/18        PT Long Term Goals - 08/26/18 6834  PT LONG TERM GOAL #1   Title  increase hip strength grossly to >/= 5/5 to promote hip/ knee stability with standing/ walking activities     Time  6    Period  Weeks    Status  New    Target Date  10/07/18      PT LONG TERM GOAL #2   Title  pt to be able to perform dailiy activities and work related tasks including squating and lifting with </= 1/10 pain    Time  6    Period  Weeks    Status  New    Target Date  10/07/18      PT LONG  TERM GOAL #3   Title  increase FOTO score to </= 26% limited to demo improvement in function     Time  6    Period  Weeks    Status  New    Target Date  10/07/18      PT LONG TERM GOAL #4   Title  pt to be I with all HEP given as of last visit to maintain and progress current level of function independently.     Time  6    Period  Weeks    Status  New    Target Date  10/07/18             Plan - 08/26/18 0841    Clinical Impression Statement  pt presents to OPPT with CC of R knee pain starting 6 months ago with no specific MOI. She has functional knee ROM and hip strength with mild weakness noted. pt stated she felt better today but pain fluctuates, unable to elicit concordant pain with testing today. English is pt's secondary language so pt did require changes in questioning to get the most accurate information. she would benefit from physical therapy to decrease R knee pain, reduce compensation strategies and maximize function by addressing the deficits listed.     Personal Factors and Comorbidities  Age;Education;Other   english is a second language   Stability/Clinical Decision Making  Stable/Uncomplicated    Clinical Decision Making  Low    Rehab Potential  Good    PT Frequency  2x / week    PT Duration  6 weeks    PT Treatment/Interventions  ADLs/Self Care Home Management;Cryotherapy;Electrical Stimulation;Iontophoresis '4mg'$ /ml Dexamethasone;Moist Heat;Ultrasound;Therapeutic exercise;Therapeutic activities;Passive range of motion;Dry needling;Patient/family education;Manual techniques;Taping;Gait training;Stair training;Balance training    PT Next Visit Plan  review FOTO assessment. Review and update HEP. hip/ core strengthening with VMO activation, modalities pRN     PT Home Exercise Plan  sidelying hip abduction, quad set with ball squeeze, bridge with ball squeeze, hip flexor stretch    Consulted and Agree with Plan of Care  Patient       Patient will benefit from skilled  therapeutic intervention in order to improve the following deficits and impairments:  Abnormal gait, Pain, Decreased strength, Improper body mechanics, Postural dysfunction, Decreased endurance  Visit Diagnosis: Chronic pain of right knee  Muscle weakness (generalized)  Other abnormalities of gait and mobility     Problem List Patient Active Problem List   Diagnosis Date Noted  . 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 Low back pain 06/26/2018  . Muscle pain, cervical 05/19/2015  . Burning in the chest 05/19/2015  . Normocytic anemia 06/27/2012  . Multiple joint pain 06/24/2012    Starr Lake PT, DPT, LAT, ATC  08/26/18  9:05 AM      [place]  [facility] [address] [place], [place], [address] Phone: [phone]   Fax:  [phone]  Name: Bridgitt Raggio MRN: [id number] Date of Birth: 1976-02-04      PHYSICAL THERAPY DISCHARGE SUMMARY  Visits from Start of Care: 1  Current functional level related to goals / functional outcomes: See goals   Remaining deficits: unknown   Education / Equipment: HEP  Plan: Patient agrees to discharge.  Patient goals were not met. Patient is being discharged due to not returning since the last visit.  ?????          Kristoffer Leamon PT, DPT, LAT, ATC  02/05/19  1:30 PM

## 2018-09-09 ENCOUNTER — Ambulatory Visit: Payer: 59 | Admitting: Physical Therapy

## 2018-09-09 ENCOUNTER — Telehealth: Payer: 59 | Admitting: Family

## 2018-09-09 DIAGNOSIS — B9689 Other specified bacterial agents as the cause of diseases classified elsewhere: Secondary | ICD-10-CM

## 2018-09-09 DIAGNOSIS — J028 Acute pharyngitis due to other specified organisms: Principal | ICD-10-CM

## 2018-09-09 MED ORDER — AMOXICILLIN-POT CLAVULANATE 875-125 MG PO TABS: 1.0000 | ORAL_TABLET | Freq: Two times a day (BID) | ORAL | 0 refills | Status: DC

## 2018-09-09 MED FILL — AMOX-CLAV 875-125 MG TABLET: 875-125 | 7 days supply | Qty: 14 | Fill #0

## 2018-09-09 NOTE — Progress Notes (Signed)

## 2018-09-11 ENCOUNTER — Telehealth: Payer: Self-pay | Admitting: Physical Therapy

## 2018-09-11 NOTE — Telephone Encounter (Signed)
Andrea Bird was contacted today regarding the temporary reduction of OP Rehab Services due to concerns for community transmission of Covid-19.    Therapist advised the patient to continue to perform their HEP and assured they had no unanswered questions at this time.  The patient was offered and declined the continuation in their POC by using methods such as an e-visit, virtual check in, or telehealth visit.    Outpatient Rehabilitation Services will follow up with this client when we are able to safely resume care at the [facility] in person.   Patient is aware we can be reached by telephone during limited business hours in the meantime.

## 2018-09-18 ENCOUNTER — Ambulatory Visit: Payer: 59 | Admitting: Physical Therapy

## 2018-09-23 ENCOUNTER — Telehealth: Payer: 59 | Admitting: Physician Assistant

## 2018-09-23 ENCOUNTER — Encounter: Payer: Self-pay | Admitting: Physical Therapy

## 2018-09-23 ENCOUNTER — Encounter: Payer: Self-pay | Admitting: Physician Assistant

## 2018-09-23 DIAGNOSIS — E559 Vitamin D deficiency, unspecified: Secondary | ICD-10-CM | POA: Diagnosis not present

## 2018-09-23 DIAGNOSIS — Z114 Encounter for screening for human immunodeficiency virus [HIV]: Secondary | ICD-10-CM | POA: Diagnosis not present

## 2018-09-23 DIAGNOSIS — R1310 Dysphagia, unspecified: Secondary | ICD-10-CM

## 2018-09-23 DIAGNOSIS — J029 Acute pharyngitis, unspecified: Secondary | ICD-10-CM | POA: Diagnosis not present

## 2018-09-23 DIAGNOSIS — R7301 Impaired fasting glucose: Secondary | ICD-10-CM | POA: Diagnosis not present

## 2018-09-23 DIAGNOSIS — R1314 Dysphagia, pharyngoesophageal phase: Secondary | ICD-10-CM | POA: Diagnosis not present

## 2018-09-23 MED FILL — FLUCONAZOLE 100 MG TAB: 100 | 7 days supply | Qty: 7 | Fill #0

## 2018-09-23 NOTE — Progress Notes (Signed)
  Based on what you shared with me, I feel your condition warrants further evaluation and I recommend that you be seen for a face to face office visit.   Andrea Bird,   As you have stated, the sore throat that you were treated for with antibiotic is better but you continue to have pain with swallowing and food gets stuck in the throat. This definitely warrants a face to face evaluation as your symptoms have not improved despite treatment.     NOTE: If you entered your credit card information for this eVisit, you will not be charged. You may see a "hold" on your card for the $35 but that hold will drop off and you will not have a charge processed.  If you are having a true medical emergency please call 911.  If you need an urgent face to face visit,  has four urgent care centers for your convenience.    PLEASE NOTE: THE [facility] LOCATIONS AND URGENT CARE CLINICS DO NOT HAVE THE TESTING FOR CORONAVIRUS COVID19 AVAILABLE.  IF YOU FEEL YOU NEED THIS TEST YOU MUST GO TO A TRIAGE LOCATION AT ONE OF THE HOSPITAL EMERGENCY DEPARTMENTS   [email or web address] to reserve your spot online an avoid wait times  [facility] [address] [place], [place] [address] Modified hours of operation: Monday-Friday, 10 AM to 6 PM  Saturday & Sunday 10 AM to 4 PM *Across the street from [employer]  [employer] (New Address!) [address] [place], [place] [address] *Just off [employer], across the road from [place]* Modified hours of operation: Monday-Friday, 10 AM to 5 PM  Closed Saturday & Sunday   The following sites will take your insurance:  . [facility]  [phone] Get Driving Directions Find a Provider at this Location  [address] [place], [place] [address] . 10 am to 8 pm Monday-Friday . 12 pm to 8 pm Saturday-Sunday   . [facility] at [facility]   [phone] Get Driving Directions Find a Provider at this Location  [address] [place], [place] [address] . 8 am to 8 pm Monday-Friday . 9 am to 6 pm Saturday . 11 am to 6 pm Sunday   . [facility] at [facility]  [phone] Get Driving Directions  [address] [place], [place] [address] . 8 am to 8 pm Monday-Friday . 8 am to 4 pm Saturday-Sunday   Your e-visit answers were reviewed by a board certified advanced clinical practitioner to complete your personal care plan.  Thank you for using e-Visits. I have spent 7 min in completion and review of this note- Ill[facility] Level [facility]

## 2018-09-25 DIAGNOSIS — R7301 Impaired fasting glucose: Secondary | ICD-10-CM | POA: Diagnosis not present

## 2018-09-25 DIAGNOSIS — R1314 Dysphagia, pharyngoesophageal phase: Secondary | ICD-10-CM | POA: Diagnosis not present

## 2018-09-27 DIAGNOSIS — R1314 Dysphagia, pharyngoesophageal phase: Secondary | ICD-10-CM | POA: Diagnosis not present

## 2018-10-01 ENCOUNTER — Ambulatory Visit: Payer: Self-pay | Admitting: Family Medicine

## 2018-10-01 NOTE — Progress Notes (Signed)
Andrea Bird D.O. [facility] [address] [place], [place] [address] Phone: [phone] Subjective:     CC: Knee pain follow-up  BJY:[id number]:Subjective   08/07/2018: Multifactorial with patient having muscle weakness as well as facet arthropathy.  Discussed with patient in great length about home exercise, icing regimen, sent to physical therapy.  No significant radicular symptoms and do not feel that advanced imaging is warranted yet.  Follow-up again in 6 weeks  Patellofemoral syndrome bilaterally.  Discussed with patient about which activities to do which was to avoid.  Increase activity slowly over the course of next several days.  Patient is to increase it and monitor.  Follow-up again after physical therapy for 4 to 6 weeks  Update 10/02/2018: Andrea Bird is a 43 y.o. female coming in with complaint of right knee and back pain. She said that overall she is doing much better. Is not having pain in her knees throughout the day. Does still have pain in her back intermittently.  Patient was found to have more of a patellofemoral syndrome as well as low back pain.  Doing very well with the vitamin D and the home exercise.  States that she is 50 to 60% better.  No pain that stops her from activity no pain that keeps her up at night.  No side effects to the vitamin D and is noticing a little more energy since she has been taking it.    No past medical history on file. No past surgical history on file. Social History   Socioeconomic History  . Marital status: Married    Spouse name: Not on file  . Number of children: 1  . Years of education: 4614  . Highest education level: Not on file  Occupational History  . Occupation: [employer]  Social Needs  . Financial resource strain: Not on file  . Food insecurity:    Worry: Not on file    Inability: Not on file  . Transportation needs:    Medical: Not on file    Non-medical: Not on file  Tobacco Use  . Smoking status:  Never Smoker  . Smokeless tobacco: Never Used  Substance and Sexual Activity  . Alcohol use: No  . Drug use: No  . Sexual activity: Not on file  Lifestyle  . Physical activity:    Days per week: Not on file    Minutes per session: Not on file  . Stress: Not on file  Relationships  . Social connections:    Talks on phone: Not on file    Gets together: Not on file    Attends religious service: Not on file    Active member of club or organization: Not on file    Attends meetings of clubs or organizations: Not on file    Relationship status: Not on file  Other Topics Concern  . 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 Healthy Mother   . Healthy Father        Current Outpatient Medications (Analgesics):  .  ibuprofen (ADVIL,MOTRIN) 800 MG tablet, Take 800 mg by mouth 3 (three) times daily.   Current Outpatient Medications (Other):  .  amoxicillin-clavulanate (AUGMENTIN) 875-125 MG tablet, Take 1 tablet by mouth 2 (two) times daily. .  Vitamin D, Ergocalciferol, (DRISDOL) 1.25 MG (50000 UT) CAPS capsule,  Take 1 capsule (50,000 Units total) by mouth every 7 (seven) days.    Past medical history, social, surgical and family history all reviewed in electronic medical record.  No pertanent information unless stated regarding to the chief complaint.   Review of Systems:  No headache, visual changes, nausea, vomiting, diarrhea, constipation, dizziness, abdominal pain, skin rash, fevers, chills, night sweats, weight loss, swollen lymph nodes, body aches, joint swelling,  chest pain, shortness of breath, mood changes.  Positive muscle aches  Objective  Blood pressure 104/64, pulse 67, height 5\' 4"  (1.626 m), weight 202 lb (91.6 kg).   General: No apparent distress alert and oriented x3 mood and affect normal, dressed appropriately.   Patient is wearing a mask HEENT: Pupils equal, extraocular movements intact  Respiratory: Patient's speak in full sentences and does not appear short of breath  Cardiovascular: No lower extremity edema, non tender, no erythema  Skin: Warm dry intact with no signs of infection or rash on extremities or on axial skeleton.  Abdomen: Soft nontender  Neuro: Cranial nerves II through XII are intact, neurovascularly intact in all extremities with 2+ DTRs and 2+ pulses.  Lymph: No lymphadenopathy of posterior or anterior cervical chain or axillae bilaterally.  Gait normal with good balance and coordination.  MSK:  Non tender with full range of motion and good stability and symmetric strength and tone of shoulders, elbows, wrist, hip, knee and ankles bilaterally.  Knee: Bilateral Normal to inspection with no erythema or effusion or obvious bony abnormalities. Mild pain over the patellofemoral ROM full in flexion and extension and lower leg rotation. Ligaments with solid consistent endpoints including ACL, PCL, LCL, MCL. Negative Mcmurray's, Apley's, and Thessalonian tests. painful patellar compression. Patellar glide with very mild crepitus. Patellar and quadriceps tendons unremarkable. Hamstring and quadriceps strength is normal.   Impression and Recommendations:     . The above documentation has been reviewed and is accurate and complete Andrea Saa, DO       Note: This dictation was prepared with Dragon dictation along with smaller phrase technology. Any transcriptional errors that result from this process are unintentional.

## 2018-10-02 ENCOUNTER — Encounter: Payer: Self-pay | Admitting: Family Medicine

## 2018-10-02 ENCOUNTER — Other Ambulatory Visit: Payer: Self-pay

## 2018-10-02 ENCOUNTER — Ambulatory Visit: Payer: 59 | Admitting: Family Medicine

## 2018-10-02 ENCOUNTER — Encounter: Payer: Self-pay | Admitting: Physical Therapy

## 2018-10-02 VITALS — BP 104/64 | HR 67 | Ht 64.0 in | Wt 202.0 lb

## 2018-10-02 DIAGNOSIS — E559 Vitamin D deficiency, unspecified: Secondary | ICD-10-CM

## 2018-10-02 DIAGNOSIS — M222X1 Patellofemoral disorders, right knee: Secondary | ICD-10-CM | POA: Diagnosis not present

## 2018-10-02 DIAGNOSIS — M545 Low back pain, unspecified: Secondary | ICD-10-CM

## 2018-10-02 DIAGNOSIS — G8929 Other chronic pain: Secondary | ICD-10-CM | POA: Diagnosis not present

## 2018-10-02 DIAGNOSIS — M2550 Pain in unspecified joint: Secondary | ICD-10-CM

## 2018-10-02 DIAGNOSIS — R1310 Dysphagia, unspecified: Secondary | ICD-10-CM | POA: Diagnosis not present

## 2018-10-02 MED ORDER — VITAMIN D (ERGOCALCIFEROL) 1.25 MG (50000 UNIT) PO CAPS: 50000.0000 [IU] | ORAL_CAPSULE | ORAL | 0 refills | Status: DC

## 2018-10-02 NOTE — Assessment & Plan Note (Signed)
I believe secondary to more the vitamin D causing some of the pain is improving.  No change in management

## 2018-10-02 NOTE — Assessment & Plan Note (Signed)
Seems to be doing well.  Doing the exercises daily.  Refilled vitamin D with patient having benefit.  Patient will continue with the conservative therapy and follow-up as needed

## 2018-10-02 NOTE — Assessment & Plan Note (Signed)
Improved and likely secondary to more muscle instability.  Poor core strength.  Discussed with patient icing regimen, home exercise, which activities to do which wants to avoid.  Patient will increase activity slowly.  Follow-up again as needed

## 2018-10-24 MED FILL — VIT D2 1.25 MG (50,000 UNIT: 1.25 MG | 84 days supply | Qty: 12 | Fill #0

## 2018-12-25 DIAGNOSIS — E559 Vitamin D deficiency, unspecified: Secondary | ICD-10-CM | POA: Diagnosis not present

## 2018-12-25 DIAGNOSIS — Z0000 Encounter for general adult medical examination without abnormal findings: Secondary | ICD-10-CM | POA: Diagnosis not present

## 2018-12-25 DIAGNOSIS — R7301 Impaired fasting glucose: Secondary | ICD-10-CM | POA: Diagnosis not present

## 2018-12-25 DIAGNOSIS — Z1159 Encounter for screening for other viral diseases: Secondary | ICD-10-CM | POA: Diagnosis not present

## 2018-12-25 DIAGNOSIS — Z13220 Encounter for screening for lipoid disorders: Secondary | ICD-10-CM | POA: Diagnosis not present

## 2018-12-25 DIAGNOSIS — Z114 Encounter for screening for human immunodeficiency virus [HIV]: Secondary | ICD-10-CM | POA: Diagnosis not present

## 2019-01-22 DIAGNOSIS — R1310 Dysphagia, unspecified: Secondary | ICD-10-CM | POA: Diagnosis not present

## 2019-01-22 DIAGNOSIS — E781 Pure hyperglyceridemia: Secondary | ICD-10-CM | POA: Diagnosis not present

## 2019-01-22 DIAGNOSIS — R7301 Impaired fasting glucose: Secondary | ICD-10-CM | POA: Diagnosis not present

## 2019-01-28 MED FILL — CONTRAVE ER 8-90 MG TABLET: 8-90 | 30 days supply | Qty: 70 | Fill #0

## 2019-02-05 ENCOUNTER — Other Ambulatory Visit (HOSPITAL_COMMUNITY): Payer: Self-pay

## 2019-02-05 DIAGNOSIS — R1310 Dysphagia, unspecified: Secondary | ICD-10-CM | POA: Diagnosis not present

## 2019-02-19 ENCOUNTER — Ambulatory Visit (HOSPITAL_COMMUNITY): Payer: 59 | Attending: Otolaryngology

## 2019-02-19 ENCOUNTER — Encounter (HOSPITAL_COMMUNITY): Payer: Self-pay

## 2019-02-19 ENCOUNTER — Other Ambulatory Visit: Payer: Self-pay

## 2019-02-19 ENCOUNTER — Ambulatory Visit (HOSPITAL_COMMUNITY)
Admission: RE | Admit: 2019-02-19 | Discharge: 2019-02-19 | Disposition: A | Discharge status: Home / Self Care | Payer: 59 | Source: Ambulatory Visit | Attending: Otolaryngology | Admitting: Otolaryngology

## 2019-02-19 ENCOUNTER — Other Ambulatory Visit (HOSPITAL_COMMUNITY): Payer: Self-pay | Admitting: Otolaryngology

## 2019-02-19 DIAGNOSIS — R1310 Dysphagia, unspecified: Secondary | ICD-10-CM

## 2019-03-05 ENCOUNTER — Other Ambulatory Visit: Payer: Self-pay

## 2019-03-05 ENCOUNTER — Encounter (HOSPITAL_COMMUNITY): Payer: Self-pay

## 2019-03-05 ENCOUNTER — Ambulatory Visit (HOSPITAL_COMMUNITY)
Admission: RE | Admit: 2019-03-05 | Discharge: 2019-03-05 | Disposition: A | Discharge status: Home / Self Care | Payer: 59 | Source: Ambulatory Visit | Attending: Otolaryngology | Admitting: Otolaryngology

## 2019-03-05 DIAGNOSIS — R1310 Dysphagia, unspecified: Secondary | ICD-10-CM

## 2019-03-19 ENCOUNTER — Ambulatory Visit (HOSPITAL_COMMUNITY)
Admission: RE | Admit: 2019-03-19 | Discharge: 2019-03-19 | Disposition: A | Discharge status: Home / Self Care | Payer: 59 | Source: Ambulatory Visit | Attending: Otolaryngology | Admitting: Otolaryngology

## 2019-03-19 ENCOUNTER — Ambulatory Visit (HOSPITAL_COMMUNITY): Payer: 59

## 2019-03-19 ENCOUNTER — Other Ambulatory Visit: Payer: Self-pay

## 2019-03-19 DIAGNOSIS — N915 Oligomenorrhea, unspecified: Secondary | ICD-10-CM | POA: Diagnosis not present

## 2019-03-19 DIAGNOSIS — R1310 Dysphagia, unspecified: Secondary | ICD-10-CM | POA: Insufficient documentation

## 2019-03-19 DIAGNOSIS — E669 Obesity, unspecified: Secondary | ICD-10-CM | POA: Diagnosis not present

## 2019-03-19 DIAGNOSIS — E559 Vitamin D deficiency, unspecified: Secondary | ICD-10-CM | POA: Diagnosis not present

## 2019-05-11 DIAGNOSIS — Z20828 Contact with and (suspected) exposure to other viral communicable diseases: Secondary | ICD-10-CM | POA: Diagnosis not present

## 2019-06-24 ENCOUNTER — Ambulatory Visit: Payer: 59 | Attending: Internal Medicine

## 2019-06-24 DIAGNOSIS — Z20822 Contact with and (suspected) exposure to covid-19: Secondary | ICD-10-CM

## 2019-06-25 LAB — NOVEL CORONAVIRUS, NAA: SARS-CoV-2, NAA: NOT DETECTED

## 2019-09-17 DIAGNOSIS — Z1231 Encounter for screening mammogram for malignant neoplasm of breast: Secondary | ICD-10-CM | POA: Diagnosis not present

## 2019-09-17 DIAGNOSIS — Z01419 Encounter for gynecological examination (general) (routine) without abnormal findings: Secondary | ICD-10-CM | POA: Diagnosis not present

## 2019-10-03 DIAGNOSIS — E2839 Other primary ovarian failure: Secondary | ICD-10-CM | POA: Diagnosis not present

## 2019-10-03 DIAGNOSIS — Z319 Encounter for procreative management, unspecified: Secondary | ICD-10-CM | POA: Diagnosis not present

## 2019-10-20 DIAGNOSIS — Z319 Encounter for procreative management, unspecified: Secondary | ICD-10-CM | POA: Diagnosis not present

## 2019-10-20 DIAGNOSIS — E2839 Other primary ovarian failure: Secondary | ICD-10-CM | POA: Diagnosis not present

## 2019-10-29 DIAGNOSIS — E2839 Other primary ovarian failure: Secondary | ICD-10-CM | POA: Diagnosis not present

## 2019-10-29 DIAGNOSIS — Z319 Encounter for procreative management, unspecified: Secondary | ICD-10-CM | POA: Diagnosis not present

## 2019-11-07 DIAGNOSIS — Z319 Encounter for procreative management, unspecified: Secondary | ICD-10-CM | POA: Diagnosis not present

## 2019-11-07 DIAGNOSIS — E2839 Other primary ovarian failure: Secondary | ICD-10-CM | POA: Diagnosis not present

## 2019-11-14 DIAGNOSIS — Z319 Encounter for procreative management, unspecified: Secondary | ICD-10-CM | POA: Diagnosis not present

## 2019-11-14 DIAGNOSIS — E2839 Other primary ovarian failure: Secondary | ICD-10-CM | POA: Diagnosis not present

## 2019-11-21 DIAGNOSIS — Z319 Encounter for procreative management, unspecified: Secondary | ICD-10-CM | POA: Diagnosis not present

## 2019-11-21 DIAGNOSIS — E2839 Other primary ovarian failure: Secondary | ICD-10-CM | POA: Diagnosis not present

## 2019-12-10 DIAGNOSIS — E2839 Other primary ovarian failure: Secondary | ICD-10-CM | POA: Diagnosis not present

## 2019-12-10 DIAGNOSIS — Z319 Encounter for procreative management, unspecified: Secondary | ICD-10-CM | POA: Diagnosis not present

## 2019-12-24 DIAGNOSIS — E2839 Other primary ovarian failure: Secondary | ICD-10-CM | POA: Diagnosis not present

## 2019-12-24 DIAGNOSIS — Z319 Encounter for procreative management, unspecified: Secondary | ICD-10-CM | POA: Diagnosis not present

## 2019-12-31 ENCOUNTER — Ambulatory Visit: Payer: 59 | Admitting: Orthopedic Surgery

## 2019-12-31 DIAGNOSIS — Z319 Encounter for procreative management, unspecified: Secondary | ICD-10-CM | POA: Diagnosis not present

## 2019-12-31 DIAGNOSIS — E2839 Other primary ovarian failure: Secondary | ICD-10-CM | POA: Diagnosis not present

## 2020-01-01 ENCOUNTER — Ambulatory Visit: Payer: 59 | Admitting: Orthopedic Surgery

## 2020-02-04 DIAGNOSIS — Z319 Encounter for procreative management, unspecified: Secondary | ICD-10-CM | POA: Diagnosis not present

## 2020-02-04 DIAGNOSIS — E2839 Other primary ovarian failure: Secondary | ICD-10-CM | POA: Diagnosis not present

## 2020-02-05 MED FILL — NORETHINDRONE 5 MG TABLET: 5 | 15 days supply | Qty: 15 | Fill #0

## 2020-02-13 DIAGNOSIS — Z319 Encounter for procreative management, unspecified: Secondary | ICD-10-CM | POA: Diagnosis not present

## 2020-02-13 DIAGNOSIS — E2839 Other primary ovarian failure: Secondary | ICD-10-CM | POA: Diagnosis not present

## 2020-02-18 MED FILL — TAMOXIFEN 20 MG TABLET: 20 | 30 days supply | Qty: 60 | Fill #0

## 2020-02-24 DIAGNOSIS — Z319 Encounter for procreative management, unspecified: Secondary | ICD-10-CM | POA: Diagnosis not present

## 2020-02-24 DIAGNOSIS — E2839 Other primary ovarian failure: Secondary | ICD-10-CM | POA: Diagnosis not present

## 2020-03-02 DIAGNOSIS — E2839 Other primary ovarian failure: Secondary | ICD-10-CM | POA: Diagnosis not present

## 2020-03-02 DIAGNOSIS — Z319 Encounter for procreative management, unspecified: Secondary | ICD-10-CM | POA: Diagnosis not present

## 2020-03-03 MED FILL — PROGESTERONE MICRONIZED 200: 200 | 30 days supply | Qty: 60 | Fill #0

## 2020-03-04 DIAGNOSIS — Z3189 Encounter for other procreative management: Secondary | ICD-10-CM | POA: Diagnosis not present

## 2020-03-23 DIAGNOSIS — N83201 Unspecified ovarian cyst, right side: Secondary | ICD-10-CM | POA: Diagnosis not present

## 2020-03-23 DIAGNOSIS — E2839 Other primary ovarian failure: Secondary | ICD-10-CM | POA: Diagnosis not present

## 2020-03-23 DIAGNOSIS — Z319 Encounter for procreative management, unspecified: Secondary | ICD-10-CM | POA: Diagnosis not present

## 2020-03-31 DIAGNOSIS — Z319 Encounter for procreative management, unspecified: Secondary | ICD-10-CM | POA: Diagnosis not present

## 2020-03-31 DIAGNOSIS — E2839 Other primary ovarian failure: Secondary | ICD-10-CM | POA: Diagnosis not present

## 2020-04-01 ENCOUNTER — Other Ambulatory Visit (HOSPITAL_COMMUNITY): Payer: Self-pay | Admitting: Obstetrics and Gynecology

## 2020-04-01 MED FILL — OVIDREL 250 MCG/0.5 ML SYRG: 250 | 30 days supply | Qty: 1 | Fill #0

## 2020-04-05 ENCOUNTER — Ambulatory Visit: Payer: 59 | Admitting: Family Medicine

## 2020-04-05 DIAGNOSIS — Z3189 Encounter for other procreative management: Secondary | ICD-10-CM | POA: Diagnosis not present

## 2020-04-05 DIAGNOSIS — Z319 Encounter for procreative management, unspecified: Secondary | ICD-10-CM | POA: Diagnosis not present

## 2020-04-05 DIAGNOSIS — E2839 Other primary ovarian failure: Secondary | ICD-10-CM | POA: Diagnosis not present

## 2020-04-08 DIAGNOSIS — Z3189 Encounter for other procreative management: Secondary | ICD-10-CM | POA: Diagnosis not present

## 2020-04-27 DIAGNOSIS — Z3189 Encounter for other procreative management: Secondary | ICD-10-CM | POA: Diagnosis not present

## 2020-04-27 DIAGNOSIS — Z319 Encounter for procreative management, unspecified: Secondary | ICD-10-CM | POA: Diagnosis not present

## 2020-04-27 DIAGNOSIS — E2839 Other primary ovarian failure: Secondary | ICD-10-CM | POA: Diagnosis not present

## 2020-04-28 DIAGNOSIS — Z20822 Contact with and (suspected) exposure to covid-19: Secondary | ICD-10-CM | POA: Diagnosis not present

## 2020-05-07 DIAGNOSIS — E2839 Other primary ovarian failure: Secondary | ICD-10-CM | POA: Diagnosis not present

## 2020-05-07 DIAGNOSIS — Z319 Encounter for procreative management, unspecified: Secondary | ICD-10-CM | POA: Diagnosis not present

## 2020-05-17 ENCOUNTER — Other Ambulatory Visit (HOSPITAL_COMMUNITY): Payer: Self-pay | Admitting: Obstetrics and Gynecology

## 2020-05-17 DIAGNOSIS — E2839 Other primary ovarian failure: Secondary | ICD-10-CM | POA: Diagnosis not present

## 2020-05-17 DIAGNOSIS — Z319 Encounter for procreative management, unspecified: Secondary | ICD-10-CM | POA: Diagnosis not present

## 2020-05-17 MED FILL — PROGESTERONE MICRONIZED 200: 200 | 30 days supply | Qty: 60 | Fill #0

## 2020-05-17 MED FILL — OVIDREL 250 MCG/0.5 ML SYRG: 250 | 30 days supply | Qty: 1 | Fill #1

## 2020-05-19 DIAGNOSIS — E2839 Other primary ovarian failure: Secondary | ICD-10-CM | POA: Diagnosis not present

## 2020-05-19 DIAGNOSIS — Z319 Encounter for procreative management, unspecified: Secondary | ICD-10-CM | POA: Diagnosis not present

## 2020-05-19 DIAGNOSIS — Z3189 Encounter for other procreative management: Secondary | ICD-10-CM | POA: Diagnosis not present

## 2020-05-24 DIAGNOSIS — Z3189 Encounter for other procreative management: Secondary | ICD-10-CM | POA: Diagnosis not present

## 2020-06-23 ENCOUNTER — Ambulatory Visit: Payer: 59 | Admitting: Family

## 2020-07-07 ENCOUNTER — Encounter: Payer: Self-pay | Admitting: Family

## 2020-07-07 ENCOUNTER — Ambulatory Visit: Payer: 59 | Admitting: Family

## 2020-07-07 ENCOUNTER — Other Ambulatory Visit: Payer: Self-pay

## 2020-07-07 ENCOUNTER — Ambulatory Visit (INDEPENDENT_AMBULATORY_CARE_PROVIDER_SITE_OTHER): Payer: 59

## 2020-07-07 ENCOUNTER — Other Ambulatory Visit: Payer: Self-pay | Admitting: Family

## 2020-07-07 VITALS — BP 130/86 | HR 73 | Temp 98.1°F | Ht 64.0 in | Wt 213.0 lb

## 2020-07-07 DIAGNOSIS — M79671 Pain in right foot: Secondary | ICD-10-CM | POA: Diagnosis not present

## 2020-07-07 DIAGNOSIS — R5383 Other fatigue: Secondary | ICD-10-CM

## 2020-07-07 DIAGNOSIS — E559 Vitamin D deficiency, unspecified: Secondary | ICD-10-CM | POA: Diagnosis not present

## 2020-07-07 DIAGNOSIS — Z20822 Contact with and (suspected) exposure to covid-19: Secondary | ICD-10-CM | POA: Diagnosis not present

## 2020-07-07 DIAGNOSIS — M542 Cervicalgia: Secondary | ICD-10-CM

## 2020-07-07 DIAGNOSIS — Z13220 Encounter for screening for lipoid disorders: Secondary | ICD-10-CM

## 2020-07-07 LAB — COMPREHENSIVE METABOLIC PANEL
ALT: 24 U/L (ref 0–35)
AST: 20 U/L (ref 0–37)
Albumin: 4.2 g/dL (ref 3.5–5.2)
Alkaline Phosphatase: 57 U/L (ref 39–117)
BUN: 10 mg/dL (ref 6–23)
CO2: 30 mEq/L (ref 19–32)
Calcium: 9.9 mg/dL (ref 8.4–10.5)
Chloride: 102 mEq/L (ref 96–112)
Creatinine, Ser: 0.7 mg/dL (ref 0.40–1.20)
GFR: 106.6 mL/min (ref >60.00)
Glucose, Bld: 87 mg/dL (ref 70–99)
Potassium: 4.3 mEq/L (ref 3.5–5.1)
Sodium: 137 mEq/L (ref 135–145)
Total Bilirubin: 0.4 mg/dL (ref 0.2–1.2)
Total Protein: 7.5 g/dL (ref 6.0–8.3)

## 2020-07-07 LAB — TSH: TSH: 1.07 u[IU]/mL (ref 0.35–4.50)

## 2020-07-07 LAB — CBC WITH DIFFERENTIAL/PLATELET
Basophils Absolute: 0 10*3/uL (ref 0.0–0.1)
Basophils Relative: 0.3 % (ref 0.0–3.0)
Eosinophils Absolute: 0.1 10*3/uL (ref 0.0–0.7)
Eosinophils Relative: 1.6 % (ref 0.0–5.0)
HCT: 36.9 % (ref 36.0–46.0)
Hemoglobin: 12.2 g/dL (ref 12.0–15.0)
Lymphocytes Relative: 46.1 % — ABNORMAL HIGH (ref 12.0–46.0)
Lymphs Abs: 2.5 10*3/uL (ref 0.7–4.0)
MCHC: 32.9 g/dL (ref 30.0–36.0)
MCV: 87.1 fl (ref 78.0–100.0)
Monocytes Absolute: 0.5 10*3/uL (ref 0.1–1.0)
Monocytes Relative: 9.7 % (ref 3.0–12.0)
Neutro Abs: 2.3 10*3/uL (ref 1.4–7.7)
Neutrophils Relative %: 42.3 % — ABNORMAL LOW (ref 43.0–77.0)
Platelets: 215 10*3/uL (ref 150.0–400.0)
RBC: 4.24 Mil/uL (ref 3.87–5.11)
RDW: 13.2 % (ref 11.5–15.5)
WBC: 5.5 10*3/uL (ref 4.0–10.5)

## 2020-07-07 LAB — LIPID PANEL
Cholesterol: 166 mg/dL (ref 0–200)
HDL: 60.4 mg/dL (ref >39.00)
LDL Cholesterol: 70 mg/dL (ref 0–99)
NonHDL: 105.66
Total CHOL/HDL Ratio: 3
Triglycerides: 179 mg/dL — ABNORMAL HIGH (ref 0.0–149.0)
VLDL: 35.8 mg/dL (ref 0.0–40.0)

## 2020-07-07 LAB — VITAMIN D 25 HYDROXY (VIT D DEFICIENCY, FRACTURES): VITD: 22.47 ng/mL — ABNORMAL LOW (ref 30.00–100.00)

## 2020-07-07 MED ORDER — TRIAMCINOLONE ACETONIDE 0.1 % EX CREA: 1.0000 "application " | TOPICAL_CREAM | Freq: Two times a day (BID) | CUTANEOUS | 0 refills | Status: DC

## 2020-07-07 MED FILL — TRIAMCINOLONE 0.1% CREAM: 0.1 | 15 days supply | Qty: 30 | Fill #0

## 2020-07-07 NOTE — Progress Notes (Signed)
Andrea Bird is a 45 y.o. female with the following history as recorded in EpicCare:  Patient Active Problem List   Diagnosis Date Noted  . 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 Low back pain 06/26/2018  . Muscle pain, cervical 05/19/2015  . Burning in the chest 05/19/2015  . Normocytic anemia 06/27/2012  . Multiple joint pain 06/24/2012    Current Outpatient Medications  Medication Sig Dispense Refill  . ibuprofen (ADVIL,MOTRIN) 800 MG tablet Take 800 mg by mouth 3 (three) times daily.    . triamcinolone (KENALOG) 0.1 % Apply 1 application topically 2 (two) times daily. 30 g 0  . leuprolide (LUPRON) 1 MG/0.2ML injection  (Patient not taking: Reported on 07/07/2020)    . norethindrone (AYGESTIN) 5 MG tablet Take 5 mg by mouth daily. (Patient not taking: Reported on 07/07/2020)    . OVIDREL 250 MCG/0.5ML injection Inject into the skin as directed. (Patient not taking: Reported on 07/07/2020)    . progesterone (PROMETRIUM) 200 MG capsule Place 200 mg vaginally 2 (two) times daily. (Patient not taking: Reported on 07/07/2020)     No current facility-administered medications for this visit.    Allergies: Patient has no known allergies.  History reviewed. No pertinent past medical history.  History reviewed. No pertinent surgical history.  Family History  Problem Relation Age of Onset  . Healthy Mother   . Healthy Father     Social History   Tobacco Use  . Smoking status: Never Smoker  . Smokeless tobacco: Never Used  Substance Use Topics  . Alcohol use: No    Subjective:  Accompanied by her son who helps translate;  Has not been seen in our office since 2019; in reviewing records, she has been seeing GYN and was seeing another provider at [facility]; Most recently was working with a fertility specialist but is not currently taking any of her hormones; LMP 12/26; denies any chance of being pregnant;  1) Chronic neck pain; no injury; no radiating symptoms; had low back pain in 2019; 2)  Right foot pain 3) Wants to get her  Vitamin D level re-checked;  Objective:  Vitals:   07/07/20 1058  BP: 130/86  Pulse: 73  Temp: 98.1 F (36.7 C)  TempSrc: Oral  SpO2: 98%  Weight: 213 lb (96.6 kg)  Height: 5' 4" (1.626 m)    General: Well developed, well nourished, in no acute distress  Skin : Warm and dry.  Head: Normocephalic and atraumatic  Eyes: Sclera and conjunctiva clear; pupils round and reactive to light; extraocular movements intact  Ears: External normal; canals clear; tympanic membranes normal  Oropharynx: Pink, supple. No suspicious lesions  Neck: Supple without thyromegaly, adenopathy  Lungs: Respirations unlabored; Musculoskeletal: No deformities; no active joint inflammation  Extremities: No edema, cyanosis, clubbing  Vessels: Symmetric bilaterally  Neurologic: Alert and oriented; speech intact; face symmetrical; moves all extremities well; CNII-XII intact without focal deficit   Assessment:  1. Neck pain   2. Other fatigue   3. Lipid screening   4. Vitamin D deficiency   5. Right foot pain     Plan:  1. Update X-ray;  2. Update labs today; 5. Refer to podiatry; suspect she has plantar's wart and could benefit from some type of orthotic;   This visit occurred during the SARS-CoV-2 public health emergency.  Safety protocols were in place, including screening questions prior to the visit, additional usage of staff PPE, and extensive cleaning of exam room while observing appropriate contact time as indicated   for disinfecting solutions.     No follow-ups on file.  Orders Placed This Encounter  Procedures  . DG Cervical Spine 2 or 3 views    Standing Status:   Future    Number of Occurrences:   1    Standing Expiration Date:   07/07/2021    Order Specific Question:   Reason for Exam (SYMPTOM  OR DIAGNOSIS REQUIRED)    Answer:   neck pain    Order Specific Question:   Is patient pregnant?    Answer:   No    Order Specific Question:   Preferred  imaging location?    Answer:   [facility]  . CBC with Differential/Platelet    Standing Status:   Future    Number of Occurrences:   1    Standing Expiration Date:   07/07/2021  . Comp Met (CMET)    Standing Status:   Future    Number of Occurrences:   1    Standing Expiration Date:   07/07/2021  . Lipid panel    Standing Status:   Future    Number of Occurrences:   1    Standing Expiration Date:   07/07/2021  . Vitamin D (25 hydroxy)    Standing Status:   Future    Number of Occurrences:   1    Standing Expiration Date:   07/07/2021  . TSH    Standing Status:   Future    Number of Occurrences:   1    Standing Expiration Date:   07/07/2021  . Ambulatory referral to Podiatry    Referral Priority:   Routine    Referral Type:   Consultation    Referral Reason:   Specialty Services Required    Requested Specialty:   Podiatry    Number of Visits Requested:   1    Requested Prescriptions   Signed Prescriptions Disp Refills  . triamcinolone (KENALOG) 0.1 % 30 g 0    Sig: Apply 1 application topically 2 (two) times daily.

## 2020-07-09 ENCOUNTER — Other Ambulatory Visit: Payer: Self-pay | Admitting: Family

## 2020-07-09 MED ORDER — VITAMIN D (ERGOCALCIFEROL) 1.25 MG (50000 UNIT) PO CAPS: 50000.0000 [IU] | ORAL_CAPSULE | ORAL | 0 refills | Status: DC

## 2020-07-09 MED FILL — VIT D2 1.25 MG (50,000 UNIT: 1.25 MG | 84 days supply | Qty: 12 | Fill #0

## 2020-07-15 NOTE — Progress Notes (Signed)
Result note letter printed to be mailed to address on file.

## 2020-07-21 ENCOUNTER — Ambulatory Visit (INDEPENDENT_AMBULATORY_CARE_PROVIDER_SITE_OTHER): Payer: 59

## 2020-07-21 ENCOUNTER — Other Ambulatory Visit: Payer: Self-pay

## 2020-07-21 ENCOUNTER — Ambulatory Visit: Payer: 59 | Admitting: Podiatry

## 2020-07-21 DIAGNOSIS — S9031XA Contusion of right foot, initial encounter: Secondary | ICD-10-CM

## 2020-07-21 DIAGNOSIS — L989 Disorder of the skin and subcutaneous tissue, unspecified: Secondary | ICD-10-CM

## 2020-07-21 DIAGNOSIS — M79671 Pain in right foot: Secondary | ICD-10-CM

## 2020-07-21 DIAGNOSIS — M79672 Pain in left foot: Secondary | ICD-10-CM

## 2020-07-26 DIAGNOSIS — Z20822 Contact with and (suspected) exposure to covid-19: Secondary | ICD-10-CM | POA: Diagnosis not present

## 2020-08-09 NOTE — Progress Notes (Signed)
° °  Subjective: 45 y.o. female presenting to the office today for evaluation of pain to the bilateral feet right greater than the left.  She states that she has had the pain for approximately 3-4 months now.  Gradual onset.  She noticed a lesion on the great toe that is very painful wearing close toed shoes.  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  She presents for further treatment and evaluation   No past medical history on file.   Objective:  Physical Exam General: Alert and oriented x3 in no acute distress  Dermatology: Hyperkeratotic lesion(s) present on the bilateral feet. Pain on palpation with a central nucleated core noted. Skin is warm, dry and supple bilateral lower extremities. Negative for open lesions or macerations.  Vascular: Palpable pedal pulses bilaterally. No edema or erythema noted. Capillary refill within normal limits.  Neurological: Epicritic and protective threshold grossly intact bilaterally.   Musculoskeletal Exam: Pain on palpation at the keratotic lesion(s) noted. Range of motion within normal limits bilateral. Muscle strength 5/5 in all groups bilateral.  Radiographic exam: Normal osseous mineralization.  Joint spaces preserved.  No acute fracture identified.  Assessment: 1.  Porokeratosis bilateral feet   Plan of Care:  1. Patient evaluated 2. Excisional debridement of keratoic lesion(s) using a chisel blade was performed without incident.  3.  Recommend OTC corn callus remover 4. Patient is to return to the clinic PRN.   *Housekeeping at [employer].  Son's name is Lucendia Herrlich, DPM [facility]  Dr. Felecia Shelling, DPM    [address]                                        [place], [place] [address]                Office [phone]  Fax [phone]

## 2020-09-15 ENCOUNTER — Other Ambulatory Visit (HOSPITAL_COMMUNITY): Payer: Self-pay

## 2020-09-15 ENCOUNTER — Encounter: Payer: Self-pay | Admitting: Family

## 2020-09-15 ENCOUNTER — Ambulatory Visit: Payer: 59 | Admitting: Family

## 2020-09-15 ENCOUNTER — Other Ambulatory Visit: Payer: Self-pay

## 2020-09-15 VITALS — BP 110/70 | HR 68 | Temp 98.2°F | Ht 64.0 in | Wt 212.8 lb

## 2020-09-15 DIAGNOSIS — M7702 Medial epicondylitis, left elbow: Secondary | ICD-10-CM

## 2020-09-15 MED ORDER — MELOXICAM 15 MG PO TABS
15.0000 mg | ORAL_TABLET | Freq: Every day | ORAL | 0 refills | Status: DC
  Filled 2020-09-15: qty 30, 30d supply, fill #0

## 2020-09-15 NOTE — Patient Instructions (Addendum)
Once you finish your prescriptive Vitamin D medication is finished, please start taking daily OTC Vitamin D3 2000 IU daily for maintenance;  Tennis Elbow  Tennis elbow is irritation and swelling (inflammation) in your outer forearm, near your elbow. Swelling affects the tissues that connect muscle to bone (tendons). Tennis elbow can happen playing any sport or doing any job where you use your elbow too much. It is caused by doing the same motion over and over. What are the causes? This condition is often caused by playing sports or doing work where you need to keep moving your forearm the same way. Sometimes, it may be caused by a sudden injury. What increases the risk? You are more likely to get tennis elbow if you play tennis or another racket sport. You also have a higher risk if you often use your hands for work. This includes:  People who use computers.  Holiday representative workers.  People who work in a factory.  Musicians.  Cooks.  Cashiers. What are the signs or symptoms?  Pain and tenderness in your forearm and the outer part of your elbow. You may have pain all the time or only when you use your arm.  A burning feeling. This starts in your elbow and spreads down your arm.  A weak grip in your hand. How is this treated? Resting and icing your arm is often the first treatment. Your doctor may also recommend:  Medicines to reduce pain and swelling.  An elbow strap.  Physical therapy. This may include massage or exercises or both.  An elbow brace. If these do not help your symptoms get better, your doctor may recommend surgery. Follow these instructions at home: If you have a brace or strap:  Wear the brace or strap as told by your doctor. Take it off only as told by your doctor.  Check the skin around the brace or strap every day. Tell your doctor if you see problems.  Loosen it if your fingers: ? Tingle. ? Become numb. ? Turn cold and blue.  Keep the brace or strap  clean.  If the brace or strap is not waterproof: ? Do not let it get wet. ? Cover it with a watertight covering when you take a bath or a shower. Managing pain, stiffness, and swelling  If told, put ice on the injured area. To do this: ? If you have a removable brace or strap, take it off as told by your doctor. ? Put ice in a plastic bag. ? Place a towel between your skin and the bag. ? Leave the ice on for 20 minutes, 2-3 times a day. ? Take off the ice if your skin turns bright red. This is very important. If you cannot feel pain, heat, or cold, you have a greater risk of damage to the area.  Move your fingers often.   Activity  Rest your elbow and wrist. Avoid activities that can cause elbow problems as told by your doctor.  Do exercises as told by your doctor.  If you lift an object, lift it with your palm facing up. Lifestyle  If your tennis elbow is caused by sports, check your equipment and make sure that: ? You are using it the right way. ? It fits you well.  If your tennis elbow is caused by work or computer use, take breaks often to stretch your arm. Talk with your manager about how you can make your condition better at work. General instructions  Take  over-the-counter and prescription medicines only as told by your doctor.  Do not smoke or use any products that contain nicotine or tobacco. If you need help quitting, ask your doctor.  Keep all follow-up visits. How is this prevented?  Before and after being active: ? Warm up and stretch before being active. ? Cool down and stretch after being active. ? Give your body time to rest between activities.  While being active: ? Make sure to use equipment that fits you. ? If you play tennis, put power in your stroke with your lower body. Avoid using your arm only.  Maintain physical fitness. This includes: ? Strength. ? Flexibility. ? Endurance.  Do exercises to strengthen the forearm muscles. Contact a doctor  if:  Your pain does not get better with treatment.  Your pain gets worse.  You have weakness in your forearm, hand, or fingers.  You cannot feel your forearm, hand, or fingers. Get help right away if:  Your pain is very bad.  You cannot move your wrist. Summary  Tennis elbow is irritation and swelling (inflammation) in your outer forearm, near your elbow.  Tennis elbow is caused by doing the same motion over and over.  Rest your elbow and wrist. Avoid activities as told by your doctor.  If told, put ice on the injured area for 20 minutes, 2-3 times a day. This information is not intended to replace advice given to you by your health care provider. Make sure you discuss any questions you have with your health care provider. Document Revised: 12/09/2019 Document Reviewed: 12/09/2019 Elsevier Patient Education  2021 [employer].

## 2020-09-15 NOTE — Progress Notes (Signed)
  Andrea Bird is a 45 y.o. female with the following history as recorded in EpicCare:  Patient Active Problem List   Diagnosis Date Noted  . 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 Low back pain 06/26/2018  . Muscle pain, cervical 05/19/2015  . Burning in the chest 05/19/2015  . Normocytic anemia 06/27/2012  . Multiple joint pain 06/24/2012    Current Outpatient Medications  Medication Sig Dispense Refill  . 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 Marland Kitchen5 mL 3  . ibuprofen (ADVIL,MOTRIN) 800 MG tablet Take 800 mg by mouth 3 (three) times daily.    . meloxicam (MOBIC) 15 MG tablet Take 1 tablet (15 mg total) by mouth daily. 30 tablet 0  . Vitamin D, Ergocalciferol, (DRISDOL) 1.25 MG (50000 UNIT) CAPS capsule TAKE 1 CAPSULE (50,000 UNITS TOTAL) BY MOUTH EVERY 7 (SEVEN) DAYS 12 capsule 0   No current facility-administered medications for this visit.    Allergies: Patient has no known allergies.  No past medical history on file.  No past surgical history on file.  Family History  Problem Relation Age of Onset  . Healthy Mother   . Healthy Father     Social History   Tobacco Use  . Smoking status: Never Smoker  . Smokeless tobacco: Never Used  Substance Use Topics  . 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  Head: Normocephalic and atraumatic  Lungs: Respirations unlabored; Musculoskeletal: No deformities; no active  joint inflammation  Extremities: No edema, cyanosis, clubbing  Vessels: Symmetric bilaterally  Neurologic: Alert and oriented; speech intact; face symmetrical; moves all extremities well; CNII-XII intact without focal deficit   Assessment:  1. Medial epicondylitis of left elbow     Plan:  Lateral component as well; encouraged to apply ice; trial of Mobic 15 mg daily; refer to sports medicine to discuss injection- she prefers to be seen in 2 weeks;  This visit occurred during the SARS-CoV-2 public health emergency.  Safety protocols were in place, including screening questions prior to the visit, additional usage of staff PPE, and extensive cleaning of exam room while observing appropriate contact time as indicated for disinfecting solutions.      No follow-ups on file.  No orders of the defined types were placed in this encounter.   Requested Prescriptions   Signed Prescriptions Disp Refills  . meloxicam (MOBIC) 15 MG tablet 30 tablet 0    Sig: Take 1 tablet (15 mg total) by mouth daily.

## 2020-09-28 NOTE — Progress Notes (Signed)
Subjective:    I'm seeing this patient as a consultation for:  Ria Clock, FNP. Note will be routed back to referring provider/PCP.  CC: L elbow pain  I, Molly Weber, LAT, ATC, am serving as scribe for Dr. Clementeen Graham.  HPI: Pt is a 45 y/o female presenting w/ L elbow pain x approximately 2 months.  She locates her pain to the lateral aspect of elbow.  She works as a Advertising copywriter for [employer]. Pt reports decrease in grip strength in L hand. Pt is R-hand dominate.  She was prescribed meloxicam by her PCP which has helped.  Radiating pain: no L elbow swelling: no Aggravating factors: repetitive activity as a housekeeper, anything Treatments tried: IBU, meloxicam  Additionally she notes right ankle pain.  Intermittently she had some swelling associate with the pain.  This has resolved a bit recently.  Pain is worse after a long day of standing.  Pain does not worsen with activity or motion typically.  No rating pain weakness or numbness.  She is not sure if the meloxicam she was prescribed for her elbow has helped or not.  Diagnostic imaging: C-spine XR- 07/07/20   Past medical history, Surgical history, Family history, Social history, Allergies, and medications have been entered into the medical record, reviewed.   Review of Systems: No new headache, visual changes, nausea, vomiting, diarrhea, constipation, dizziness, abdominal pain, skin rash, fevers, chills, night sweats, weight loss, swollen lymph nodes, body aches, joint swelling, muscle aches, chest pain, shortness of breath, mood changes, visual or auditory hallucinations.   Objective:    Vitals:   09/29/20 1058  BP: 112/76  Pulse: 76  SpO2: 98%   General: Well Developed, well nourished, and in no acute distress.  Neuro/Psych: Alert and oriented x3, extra-ocular muscles intact, able to move all 4 extremities, sensation grossly intact. Skin: Warm and dry, no rashes noted.  Respiratory: Not using accessory muscles, speaking in  full sentences, trachea midline.  Cardiovascular: Pulses palpable, no extremity edema. Abdomen: Does not appear distended. MSK:  Right elbow normal-appearing normal motion normal strength. Tender palpation lateral epicondyle. Pain at lateral epicondyle with resisted wrist and finger extension and with grip.  Right ankle normal-appearing normal motion nontender stable ligamentous exam.   Lab and Radiology Results  X-ray images right ankle obtained today personally and independently interpreted Minimal degenerative changes.  No acute fractures. Await formal radiology review  Impression and Recommendations:    Assessment and Plan: 45 y.o. female with right lateral elbow pain thought to be due to lateral epicondylitis.  Maximize conservative management with Voltaren gel Home exercise program as taught in clinic today by ATC.  Also recommend a wrist brace with heavy duty activity.  Reassess in about a month.  If not improved would consider injection.  Strongly recommend against injection as early management strategy for this condition.  Right ankle pain less clear however thought to be due to possible DJD.  Recommend conservative management strategies including Voltaren gel compressive sleeve.  Reassess in a month and if not better would also consider injection.  PDMP not reviewed this encounter. Orders Placed This Encounter  Procedures  . DG Ankle Complete Right    Standing Status:   Future    Number of Occurrences:   1    Standing Expiration Date:   09/29/2021    Order Specific Question:   Reason for Exam (SYMPTOM  OR DIAGNOSIS REQUIRED)    Answer:   eval ankle pain    Order Specific  Question:   Is patient pregnant?    Answer:   No    Order Specific Question:   Preferred imaging location?    Answer:   [facility]   No orders of the defined types were placed in this encounter.   Discussed warning signs or symptoms. Please see discharge instructions. Patient expresses  understanding.   The above documentation has been reviewed and is accurate and complete Clementeen Graham, M.D.

## 2020-09-29 ENCOUNTER — Ambulatory Visit (INDEPENDENT_AMBULATORY_CARE_PROVIDER_SITE_OTHER): Payer: 59

## 2020-09-29 ENCOUNTER — Ambulatory Visit: Payer: 59 | Admitting: Family Medicine

## 2020-09-29 ENCOUNTER — Other Ambulatory Visit: Payer: Self-pay

## 2020-09-29 VITALS — BP 112/76 | HR 76 | Ht 64.0 in | Wt 216.2 lb

## 2020-09-29 DIAGNOSIS — M7711 Lateral epicondylitis, right elbow: Secondary | ICD-10-CM | POA: Diagnosis not present

## 2020-09-29 DIAGNOSIS — M25571 Pain in right ankle and joints of right foot: Secondary | ICD-10-CM

## 2020-09-29 NOTE — Patient Instructions (Addendum)
Thank you for coming in today.  Please complete the exercises that the athletic trainer went over with you: View at [email or web address] using code: [id number]   Please use voltaren gel up to 4x daily for pain as needed.   Wrist brace as needed.  Please go to [facility] supply to get the Wrist brace we talked about today. You may also be able to get it from [employer].   Please get an Xray today before you leave  I recommend you obtained a compression sleeve to help with your joint problems. There are many options on the market however I recommend obtaining a Full Ankle Body Helix compression sleeve.  You can find information (including how to appropriate measure yourself for sizing) can be found at www.Body [employer].ch.  Many of these products are health savings account (HSA) eligible.   You can use the compression sleeve at any time throughout the day but is most important to use while being active as well as for 2 hours post-activity.   It is appropriate to ice following activity with the compression sleeve in place.   Recheck in 1 month.

## 2020-09-30 NOTE — Progress Notes (Signed)
X-ray right ankle looks normal to radiology

## 2020-10-27 DIAGNOSIS — Z1231 Encounter for screening mammogram for malignant neoplasm of breast: Secondary | ICD-10-CM | POA: Diagnosis not present

## 2020-10-27 DIAGNOSIS — Z01419 Encounter for gynecological examination (general) (routine) without abnormal findings: Secondary | ICD-10-CM | POA: Diagnosis not present

## 2020-10-27 DIAGNOSIS — Z6836 Body mass index (BMI) 36.0-36.9, adult: Secondary | ICD-10-CM | POA: Diagnosis not present

## 2020-11-09 NOTE — Progress Notes (Signed)
I, [name], LAT, ATC, am serving as scribe for Dr. Clementeen Graham.  Andrea Bird is a 45 y.o. female who presents to [facility] today for f/u of R elbow and R ankle pain.  She was last seen by Dr. Denyse Amass on 09/29/20 and was shown a HEP focusing on wrist extensor eccentrics.  She was advised to use Voltaren gel and a wrist brace for heavy activity.  For her R ankle, she was advised to use a compression sleeve. Pt is R-hand dominate and works as a Advertising copywriter for [employer]. Since her last visit, pt reports both R elbow and R ankle are still bothering her. Pt has been compliant in HEP. Pt is unable to wear wrist brace while working.  Diagnostic imaging: R ankle XR- 09/29/20; R foot XR- 07/21/20  Pertinent review of systems: No fevers or chills  Relevant historical information: Patellofemoral pain syndrome   Exam:  BP 107/73 (BP Location: Right Arm, Patient Position: Sitting, Cuff Size: Normal)   Pulse 70   Ht 5\' 4"  (1.626 m)   Wt 211 lb 3.2 oz (95.8 kg)   SpO2 97%   BMI 36.25 kg/m  General: Well Developed, well nourished, and in no acute distress.   MSK: Right elbow: Normal-appearing Tender palpation lateral epicondyle. Normal elbow motion. Pain with resisted wrist extension.   Right ankle normal-appearing nontender normal ankle motion.    Lab and Radiology Results  DG Ankle Complete Right  Result Date: 09/29/2020 CLINICAL DATA:  Ankle pain EXAM: RIGHT ANKLE - COMPLETE 3+ VIEW COMPARISON:  None. FINDINGS: There is no evidence of fracture, dislocation, or joint effusion. There is no evidence of arthropathy or other focal bone abnormality. Soft tissues are unremarkable. IMPRESSION: Negative. Electronically Signed   By: 10/01/2020 M.D.   On: 09/29/2020 22:48   I, 10/01/2020, personally (independently) visualized and performed the interpretation of the images attached in this note.  Procedure: Real-time Ultrasound Guided Injection of the ankle  lateral approach Device: Philips Affiniti 50G Images permanently stored and available for review in PACS Verbal informed consent obtained.  Discussed risks and benefits of procedure. Warned about infection bleeding damage to structures skin hypopigmentation and fat atrophy among others. Patient expresses understanding and agreement Time-out conducted.   Noted no overlying erythema, induration, or other signs of local infection.   Skin prepped in a sterile fashion.   Local anesthesia: Topical Ethyl chloride.   With sterile technique and under real time ultrasound guidance:  40 mg of Kenalog and 2 mL of Marcaine injected into ankle joint. Fluid seen entering the joint capsule.   Completed without difficulty   Pain immediately resolved suggesting accurate placement of the medication.   Advised to call if fevers/chills, erythema, induration, drainage, or persistent bleeding.   Images permanently stored and available for review in the ultrasound unit.  Impression: Technically successful ultrasound guided injection.      Assessment and Plan: 45 y.o. female with left ankle pain unclear etiology.  X-ray was unremarkable last month.  Plan for steroid injection today and home exercise program and recheck in about a month.  Right elbow pain due to lateral epicondylitis.  Mild improvement with home exercise program.  We will add nitroglycerin patch protocol and recheck in a month.  Consider injection at that time if not better.   PDMP not reviewed this encounter. Orders Placed This Encounter  Procedures  . 45 LIMITED JOINT SPACE STRUCTURES UP LEFT(NO LINKED CHARGES)  Standing Status:   Future    Number of Occurrences:   1    Standing Expiration Date:   05/12/2021    Order Specific Question:   Reason for Exam (SYMPTOM  OR DIAGNOSIS REQUIRED)    Answer:   left elbow pain    Order Specific Question:   Preferred imaging location?    Answer:   [facility] ordered  this encounter  Medications  . nitroGLYCERIN (NITRODUR - DOSED IN MG/24 HR) 0.2 mg/hr patch    Sig: Apply 1/4 patch daily to tendon for tendonitis.    Dispense:  30 patch    Refill:  1     Discussed warning signs or symptoms. Please see discharge instructions. Patient expresses understanding.   The above documentation has been reviewed and is accurate and complete Clementeen Graham, M.D.

## 2020-11-10 ENCOUNTER — Other Ambulatory Visit (HOSPITAL_COMMUNITY): Payer: Self-pay

## 2020-11-10 ENCOUNTER — Ambulatory Visit (INDEPENDENT_AMBULATORY_CARE_PROVIDER_SITE_OTHER): Payer: 59 | Admitting: Family Medicine

## 2020-11-10 ENCOUNTER — Ambulatory Visit: Payer: Self-pay

## 2020-11-10 ENCOUNTER — Other Ambulatory Visit: Payer: Self-pay

## 2020-11-10 VITALS — BP 107/73 | HR 70 | Ht 64.0 in | Wt 211.2 lb

## 2020-11-10 DIAGNOSIS — M25522 Pain in left elbow: Secondary | ICD-10-CM | POA: Diagnosis not present

## 2020-11-10 DIAGNOSIS — M7711 Lateral epicondylitis, right elbow: Secondary | ICD-10-CM

## 2020-11-10 DIAGNOSIS — G8929 Other chronic pain: Secondary | ICD-10-CM | POA: Diagnosis not present

## 2020-11-10 MED ORDER — NITROGLYCERIN 0.2 MG/HR TD PT24
MEDICATED_PATCH | Freq: Every day | TRANSDERMAL | 1 refills | Status: DC
  Filled 2020-11-10: qty 22, 88d supply, fill #0

## 2020-11-10 NOTE — Patient Instructions (Addendum)
Thank you for coming in today.  Call or go to the ER if you develop a large red swollen joint with extreme pain or oozing puss.   Nitroglycerin Protocol   Apply 1/4 nitroglycerin patch to affected area daily.  Change position of patch within the affected area every 24 hours.  You may experience a headache during the first 1-2 weeks of using the patch, these should subside.  If you experience headaches after beginning nitroglycerin patch treatment, you may take your preferred over the counter pain reliever.  Another side effect of the nitroglycerin patch is skin irritation or rash related to patch adhesive.  Please notify our office if you develop more severe headaches or rash, and stop the patch.  Tendon healing with nitroglycerin patch may require 12 to 24 weeks depending on the extent of injury.  Men should not use if taking Viagra, Cialis, or Levitra.   Do not use if you have migraines or rosacea.    Recheck in about 1 month.   Let me know sooner if you are not doing well.

## 2020-12-20 DIAGNOSIS — Z20822 Contact with and (suspected) exposure to covid-19: Secondary | ICD-10-CM | POA: Diagnosis not present

## 2020-12-21 NOTE — Progress Notes (Signed)
   I, [name], LAT, ATC, am serving as scribe for Dr. Clementeen Graham.  Andrea Bird is a 45 y.o. female who presents to [facility] at [facility] today for f/u of R elbow and R ankle pain.  She was last seen by Dr. Denyse Amass on 11/10/20 and noted no change in her symptoms.  She had a R ankle injection and was prescribed nitroglycerin patches.  She was advised to con't her HEP.  Since her last visit, pt reports that her R elbow pain is better but is unable to use the nitroglycerin patches due to HA.  She reports no pain in her R ankle after getting the injection at her last visit.  Diagnostic testing: R ankle XR- 09/29/20; R foot XR- 07/21/20  Pertinent review of systems: no fever or chills  Relevant historical information: Normocytic anemia   Exam:  BP 106/78 (BP Location: Right Arm, Patient Position: Sitting, Cuff Size: Normal)   Pulse 74   Ht 5\' 4"  (1.626 m)   Wt 210 lb (95.3 kg)   SpO2 96%   BMI 36.05 kg/m  General: Well Developed, well nourished, and in no acute distress.   MSK: Right elbow  Normal appearing.  Normal motion.  Minimally TTP lateral epicondyle.  No pain with resisted wrist or finger extension Pulses and cap refill intact   Right ankle normal motion normal gait.     Assessment and Plan: 45 y.o. female with right elbow pain lateral epicondylitis.  Significantly improved with home exercise program.  Unable to tolerate nitroglycerin patches.  She improved dramatically with home exercise program however.  Plan for continued home exercise program and watchful waiting.  Pain today is not bad enough to proceed with injection.  Recheck back as needed especially if not improving enough or if worsening sufficiently to need injections.  Ankle pain significantly improved with injection.  Watchful waiting.  Discussed warning signs or symptoms. Please see discharge instructions. Patient expresses understanding.   The above documentation has been reviewed and  is accurate and complete 47, M.D.

## 2020-12-22 ENCOUNTER — Encounter: Payer: Self-pay | Admitting: Family Medicine

## 2020-12-22 ENCOUNTER — Other Ambulatory Visit: Payer: Self-pay

## 2020-12-22 ENCOUNTER — Ambulatory Visit: Payer: 59 | Admitting: Family Medicine

## 2020-12-22 VITALS — BP 106/78 | HR 74 | Ht 64.0 in | Wt 210.0 lb

## 2020-12-22 DIAGNOSIS — M7711 Lateral epicondylitis, right elbow: Secondary | ICD-10-CM | POA: Diagnosis not present

## 2020-12-22 DIAGNOSIS — M25571 Pain in right ankle and joints of right foot: Secondary | ICD-10-CM

## 2020-12-22 NOTE — Patient Instructions (Signed)
Thank you for coming in today.   Return as needed especially if the elbow hurts bad enough for an injection.   Continue the exercises.

## 2021-03-16 ENCOUNTER — Other Ambulatory Visit: Payer: Self-pay

## 2021-03-16 ENCOUNTER — Other Ambulatory Visit (HOSPITAL_COMMUNITY): Payer: Self-pay

## 2021-03-16 ENCOUNTER — Ambulatory Visit: Payer: 59 | Admitting: Podiatry

## 2021-03-16 DIAGNOSIS — M722 Plantar fascial fibromatosis: Secondary | ICD-10-CM | POA: Diagnosis not present

## 2021-03-16 DIAGNOSIS — L989 Disorder of the skin and subcutaneous tissue, unspecified: Secondary | ICD-10-CM | POA: Diagnosis not present

## 2021-03-16 MED ORDER — MELOXICAM 15 MG PO TABS
15.0000 mg | ORAL_TABLET | Freq: Every day | ORAL | 1 refills | Status: DC
  Filled 2021-03-16: qty 30, 30d supply, fill #0
  Filled 2021-05-03: qty 30, 30d supply, fill #1

## 2021-03-22 MED ORDER — BETAMETHASONE SOD PHOS & ACET 6 (3-3) MG/ML IJ SUSP: 3.0000 mg | Freq: Once | INTRAMUSCULAR | Status: AC

## 2021-03-22 NOTE — Progress Notes (Signed)
   Subjective: 45 y.o. female presenting to the office today for evaluation of recurrent wart to the plantar aspect of the right foot.  She states is very uncomfortable when walking.  She has had it treated in the past but it continues to recur.  She presents for further treatment and evaluation  Patient also has a new complaint today regarding pain and tenderness to the right plantar heel.  This is been ongoing for a few weeks.  She has discomfort when walking.  She has not done anything for treatment currently.   No past medical history on file.   Objective: Physical Exam General: The patient is alert and oriented x3 in no acute distress.  Dermatology: Skin is warm, dry and supple bilateral lower extremities. Negative for open lesions or macerations bilateral.  Hyperkeratotic lesion present to the right foot with associated tenderness to palpation and a central nucleated core.  Vascular: Dorsalis Pedis and Posterior Tibial pulses palpable bilateral.  Capillary fill time is immediate to all digits.  Neurological: Epicritic and protective threshold intact bilateral.   Musculoskeletal: Tenderness to palpation to the plantar aspect of the right heel along the plantar fascia. All other joints range of motion within normal limits bilateral. Strength 5/5 in all groups bilateral.   Assessment: 1. Plantar fasciitis right 2.  Porokeratosis bilateral feet  Plan of Care:  1. Patient evaluated. Xrays reviewed.   2. Injection of 0.5cc Celestone soluspan injected into the right plantar fascia  3. Rx for Meloxicam ordered for patient. 4.  INstructed patient regarding therapies and modalities at home to alleviate symptoms.  5.  Excisional debridement of the hyperkeratotic callus lesion was performed using a 312 scalpel without incident or bleeding.  Salicylic acid was applied.  Salicylic acid was also provided for the patient to apply daily. 6.  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  Son's name is Lucendia Herrlich, DPM [facility]  Dr. Felecia Shelling, DPM    [address] [place], [place] [address]                Office [phone]  Fax [phone]

## 2021-04-04 ENCOUNTER — Other Ambulatory Visit (HOSPITAL_COMMUNITY): Payer: Self-pay

## 2021-04-27 ENCOUNTER — Ambulatory Visit: Payer: 59 | Admitting: Podiatry

## 2021-04-27 ENCOUNTER — Other Ambulatory Visit: Payer: Self-pay

## 2021-04-27 DIAGNOSIS — L989 Disorder of the skin and subcutaneous tissue, unspecified: Secondary | ICD-10-CM

## 2021-04-27 DIAGNOSIS — M722 Plantar fascial fibromatosis: Secondary | ICD-10-CM | POA: Diagnosis not present

## 2021-05-03 ENCOUNTER — Other Ambulatory Visit (HOSPITAL_COMMUNITY): Payer: Self-pay

## 2021-05-06 NOTE — Progress Notes (Signed)
   Subjective: 45 y.o. female presenting to the office today for evaluation of recurrent wart to the plantar aspect of the right foot.  She states is very uncomfortable when walking.  She has had it treated in the past but it continues to recur.  She presents for further treatment and evaluation  Patient also has a new complaint today regarding pain and tenderness to the right plantar heel.  This is been ongoing for a few weeks.  She has discomfort when walking.  She has not done anything for treatment currently.   No past medical history on file.   Objective: Physical Exam General: The patient is alert and oriented x3 in no acute distress.  Dermatology: Skin is warm, dry and supple bilateral lower extremities. Negative for open lesions or macerations bilateral.  Hyperkeratotic lesion present to the right foot with associated tenderness to palpation and a central nucleated core.  Vascular: Dorsalis Pedis and Posterior Tibial pulses palpable bilateral.  Capillary fill time is immediate to all digits.  Neurological: Epicritic and protective threshold intact bilateral.   Musculoskeletal: Tenderness to palpation to the plantar aspect of the right heel along the plantar fascia. All other joints range of motion within normal limits bilateral. Strength 5/5 in all groups bilateral.   Assessment: 1. Plantar fasciitis right 2.  Porokeratosis bilateral feet  Plan of Care:  1. Patient evaluated. Xrays reviewed.   2.  Patient declined injection today 3.  Continue meloxicam 15 mg daily as needed 4.  Additional excisional debridement of the hyperkeratotic callus lesion was performed using a 312 scalpel without incident or bleeding.  Salicylic acid was applied.  Salicylic acid was also provided for the patient to apply daily. 5.  Plantar fascial brace was dispensed.  Wear daily as needed 6.  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  Son's name is Lucendia Herrlich,  DPM [facility]  Dr. Felecia Shelling, DPM    [address] [place], [place] [address]                Office [phone]  Fax [phone]

## 2021-07-20 ENCOUNTER — Ambulatory Visit: Payer: 59 | Admitting: Family Medicine

## 2021-08-31 ENCOUNTER — Other Ambulatory Visit (HOSPITAL_COMMUNITY): Payer: Self-pay

## 2021-08-31 ENCOUNTER — Ambulatory Visit: Payer: 59 | Admitting: Podiatry

## 2021-08-31 ENCOUNTER — Other Ambulatory Visit: Payer: Self-pay

## 2021-08-31 DIAGNOSIS — M722 Plantar fascial fibromatosis: Secondary | ICD-10-CM | POA: Diagnosis not present

## 2021-08-31 MED ORDER — BETAMETHASONE SOD PHOS & ACET 6 (3-3) MG/ML IJ SUSP
3.0000 mg | Freq: Once | INTRAMUSCULAR | Status: AC
  Administered 2021-08-31: 3 mg via INTRA_ARTICULAR

## 2021-08-31 MED ORDER — DICLOFENAC SODIUM 75 MG PO TBEC
75.0000 mg | DELAYED_RELEASE_TABLET | Freq: Two times a day (BID) | ORAL | 1 refills | Status: DC
  Filled 2021-08-31: qty 60, 30d supply, fill #0

## 2021-08-31 NOTE — Progress Notes (Signed)
? ?  Subjective: ?46 y.o. female presenting to the office today for follow-up evaluation of plantar fasciitis to the right foot.  Patient continues to have pain and tenderness associated to the foot.  She says that the meloxicam does not help.  She also says that the injection only helped for about 3 months.  She presents for further treatment and evaluation ? ?No past medical history on file. ? ? ?Objective: ?Physical Exam ?General: The patient is alert and oriented x3 in no acute distress. ? ?Dermatology: Skin is warm, dry and supple bilateral lower extremities. Negative for open lesions or macerations bilateral.  Hyperkeratotic lesion present to the right foot with associated tenderness to palpation and a central nucleated core. ? ?Vascular: Dorsalis Pedis and Posterior Tibial pulses palpable bilateral.  Capillary fill time is immediate to all digits. ? ?Neurological: Epicritic and protective threshold intact bilateral.  ? ?Musculoskeletal: There continues to be tenderness to palpation to the plantar aspect of the right heel along the plantar fascia. All other joints range of motion within normal limits bilateral. Strength 5/5 in all groups bilateral.  ? ?Assessment: ?1. Plantar fasciitis right ? ?Plan of Care:  ?1. Patient evaluated.  ?2.  Injection of 0.5 cc Celestone Soluspan injected in the right plantar fascia.  Patient states that she got about 3 months of relief with the last injection ?3.  Continue plantar fascial brace ?4.  Continue [place] shoes ?5.  Order placed for physical therapy at [facility] ?6.  Prescription for diclofenac 75 mg 2 times daily.  Patient states that she did not get much relief with the meloxicam.   ?7.  Return to clinic 6 weeks ? 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 From [place], [place]. ? ? ?Felecia Shelling, DPM ?[facility] ? ?Dr. Felecia Shelling, DPM  ?  ?[address]                                        ?[place], [place] [address]                 ?Office [phone]  ?Fax [phone] ? ? ? ? ?

## 2021-09-12 ENCOUNTER — Telehealth: Payer: Self-pay | Admitting: Podiatry

## 2021-09-12 NOTE — Telephone Encounter (Signed)
Patient called about PT order for [facility],  they have not heard from them to schedule. I gave patient their phone number, but they would like you to follow up with them, as well .

## 2021-09-13 NOTE — Telephone Encounter (Signed)
Please fax referral to [phone]

## 2021-09-13 NOTE — Telephone Encounter (Signed)
Bonnita Levan!  Could you please follow-up with this? Thanks, Dr. Amalia Hailey

## 2021-09-14 ENCOUNTER — Ambulatory Visit: Payer: 59 | Admitting: Podiatry

## 2021-09-27 NOTE — Telephone Encounter (Signed)
Patient son called back and stated that he still has not heard from [facility]. ? ?Please send fax over. ?

## 2021-09-28 ENCOUNTER — Other Ambulatory Visit: Payer: Self-pay | Admitting: Podiatry

## 2021-09-28 NOTE — Telephone Encounter (Signed)
Laverle Hobby, I just filled out another [facility] form and placed it on your desk.  Can you please run it up to them and make sure they get it?  Thanks much, Dr. Logan Bores

## 2021-10-03 NOTE — Telephone Encounter (Signed)
I'm not sure if it was sent upstairs. Evelyn? - Dr. Logan Bores

## 2021-10-27 ENCOUNTER — Other Ambulatory Visit: Payer: Self-pay | Admitting: *Deleted

## 2021-10-27 ENCOUNTER — Telehealth: Payer: Self-pay | Admitting: *Deleted

## 2021-10-27 DIAGNOSIS — M722 Plantar fascial fibromatosis: Secondary | ICD-10-CM

## 2021-10-27 DIAGNOSIS — M79672 Pain in left foot: Secondary | ICD-10-CM

## 2021-10-27 DIAGNOSIS — M79671 Pain in right foot: Secondary | ICD-10-CM

## 2021-10-27 NOTE — Telephone Encounter (Signed)
Sam w/ [facility] is calling for the status of a referral that patient said was supposed to be sent.   I do not see an order but since the lov notes said a referral is supposed to be sent , submitted order for PT, faxed to : [phone] and called, did receive.

## 2021-10-28 NOTE — Telephone Encounter (Signed)
Thanks so much!  Dr. Logan Bores

## 2021-11-09 DIAGNOSIS — R269 Unspecified abnormalities of gait and mobility: Secondary | ICD-10-CM | POA: Diagnosis not present

## 2021-11-09 DIAGNOSIS — M25671 Stiffness of right ankle, not elsewhere classified: Secondary | ICD-10-CM | POA: Diagnosis not present

## 2021-11-09 DIAGNOSIS — M62571 Muscle wasting and atrophy, not elsewhere classified, right ankle and foot: Secondary | ICD-10-CM | POA: Diagnosis not present

## 2021-11-09 DIAGNOSIS — M25672 Stiffness of left ankle, not elsewhere classified: Secondary | ICD-10-CM | POA: Diagnosis not present

## 2021-11-09 DIAGNOSIS — M722 Plantar fascial fibromatosis: Secondary | ICD-10-CM | POA: Diagnosis not present

## 2021-11-09 DIAGNOSIS — M62572 Muscle wasting and atrophy, not elsewhere classified, left ankle and foot: Secondary | ICD-10-CM | POA: Diagnosis not present

## 2021-11-09 DIAGNOSIS — M25571 Pain in right ankle and joints of right foot: Secondary | ICD-10-CM | POA: Diagnosis not present

## 2021-11-09 DIAGNOSIS — M25572 Pain in left ankle and joints of left foot: Secondary | ICD-10-CM | POA: Diagnosis not present

## 2021-11-14 DIAGNOSIS — M62572 Muscle wasting and atrophy, not elsewhere classified, left ankle and foot: Secondary | ICD-10-CM | POA: Diagnosis not present

## 2021-11-14 DIAGNOSIS — M722 Plantar fascial fibromatosis: Secondary | ICD-10-CM | POA: Diagnosis not present

## 2021-11-14 DIAGNOSIS — M25671 Stiffness of right ankle, not elsewhere classified: Secondary | ICD-10-CM | POA: Diagnosis not present

## 2021-11-14 DIAGNOSIS — M25572 Pain in left ankle and joints of left foot: Secondary | ICD-10-CM | POA: Diagnosis not present

## 2021-11-14 DIAGNOSIS — R269 Unspecified abnormalities of gait and mobility: Secondary | ICD-10-CM | POA: Diagnosis not present

## 2021-11-14 DIAGNOSIS — M62571 Muscle wasting and atrophy, not elsewhere classified, right ankle and foot: Secondary | ICD-10-CM | POA: Diagnosis not present

## 2021-11-14 DIAGNOSIS — M25672 Stiffness of left ankle, not elsewhere classified: Secondary | ICD-10-CM | POA: Diagnosis not present

## 2021-11-14 DIAGNOSIS — M25571 Pain in right ankle and joints of right foot: Secondary | ICD-10-CM | POA: Diagnosis not present

## 2021-11-16 DIAGNOSIS — M62572 Muscle wasting and atrophy, not elsewhere classified, left ankle and foot: Secondary | ICD-10-CM | POA: Diagnosis not present

## 2021-11-16 DIAGNOSIS — M25572 Pain in left ankle and joints of left foot: Secondary | ICD-10-CM | POA: Diagnosis not present

## 2021-11-16 DIAGNOSIS — M25671 Stiffness of right ankle, not elsewhere classified: Secondary | ICD-10-CM | POA: Diagnosis not present

## 2021-11-16 DIAGNOSIS — M722 Plantar fascial fibromatosis: Secondary | ICD-10-CM | POA: Diagnosis not present

## 2021-11-16 DIAGNOSIS — M25672 Stiffness of left ankle, not elsewhere classified: Secondary | ICD-10-CM | POA: Diagnosis not present

## 2021-11-16 DIAGNOSIS — R269 Unspecified abnormalities of gait and mobility: Secondary | ICD-10-CM | POA: Diagnosis not present

## 2021-11-16 DIAGNOSIS — M25571 Pain in right ankle and joints of right foot: Secondary | ICD-10-CM | POA: Diagnosis not present

## 2021-11-16 DIAGNOSIS — M62571 Muscle wasting and atrophy, not elsewhere classified, right ankle and foot: Secondary | ICD-10-CM | POA: Diagnosis not present

## 2021-11-22 DIAGNOSIS — M25571 Pain in right ankle and joints of right foot: Secondary | ICD-10-CM | POA: Diagnosis not present

## 2021-11-22 DIAGNOSIS — M25572 Pain in left ankle and joints of left foot: Secondary | ICD-10-CM | POA: Diagnosis not present

## 2021-11-22 DIAGNOSIS — M62571 Muscle wasting and atrophy, not elsewhere classified, right ankle and foot: Secondary | ICD-10-CM | POA: Diagnosis not present

## 2021-11-22 DIAGNOSIS — M62572 Muscle wasting and atrophy, not elsewhere classified, left ankle and foot: Secondary | ICD-10-CM | POA: Diagnosis not present

## 2021-11-22 DIAGNOSIS — M722 Plantar fascial fibromatosis: Secondary | ICD-10-CM | POA: Diagnosis not present

## 2021-11-22 DIAGNOSIS — R269 Unspecified abnormalities of gait and mobility: Secondary | ICD-10-CM | POA: Diagnosis not present

## 2021-11-22 DIAGNOSIS — M25671 Stiffness of right ankle, not elsewhere classified: Secondary | ICD-10-CM | POA: Diagnosis not present

## 2021-11-22 DIAGNOSIS — M25672 Stiffness of left ankle, not elsewhere classified: Secondary | ICD-10-CM | POA: Diagnosis not present

## 2021-11-24 DIAGNOSIS — M25571 Pain in right ankle and joints of right foot: Secondary | ICD-10-CM | POA: Diagnosis not present

## 2021-11-24 DIAGNOSIS — M25572 Pain in left ankle and joints of left foot: Secondary | ICD-10-CM | POA: Diagnosis not present

## 2021-11-24 DIAGNOSIS — R269 Unspecified abnormalities of gait and mobility: Secondary | ICD-10-CM | POA: Diagnosis not present

## 2021-11-24 DIAGNOSIS — M62572 Muscle wasting and atrophy, not elsewhere classified, left ankle and foot: Secondary | ICD-10-CM | POA: Diagnosis not present

## 2021-11-24 DIAGNOSIS — M25672 Stiffness of left ankle, not elsewhere classified: Secondary | ICD-10-CM | POA: Diagnosis not present

## 2021-11-24 DIAGNOSIS — M25671 Stiffness of right ankle, not elsewhere classified: Secondary | ICD-10-CM | POA: Diagnosis not present

## 2021-11-24 DIAGNOSIS — M722 Plantar fascial fibromatosis: Secondary | ICD-10-CM | POA: Diagnosis not present

## 2021-11-24 DIAGNOSIS — M62571 Muscle wasting and atrophy, not elsewhere classified, right ankle and foot: Secondary | ICD-10-CM | POA: Diagnosis not present

## 2021-11-29 ENCOUNTER — Other Ambulatory Visit (HOSPITAL_COMMUNITY): Payer: Self-pay

## 2021-11-29 DIAGNOSIS — M62571 Muscle wasting and atrophy, not elsewhere classified, right ankle and foot: Secondary | ICD-10-CM | POA: Diagnosis not present

## 2021-11-29 DIAGNOSIS — M62572 Muscle wasting and atrophy, not elsewhere classified, left ankle and foot: Secondary | ICD-10-CM | POA: Diagnosis not present

## 2021-11-29 DIAGNOSIS — R269 Unspecified abnormalities of gait and mobility: Secondary | ICD-10-CM | POA: Diagnosis not present

## 2021-11-29 DIAGNOSIS — M25572 Pain in left ankle and joints of left foot: Secondary | ICD-10-CM | POA: Diagnosis not present

## 2021-11-29 DIAGNOSIS — M25571 Pain in right ankle and joints of right foot: Secondary | ICD-10-CM | POA: Diagnosis not present

## 2021-11-29 DIAGNOSIS — M25672 Stiffness of left ankle, not elsewhere classified: Secondary | ICD-10-CM | POA: Diagnosis not present

## 2021-11-29 DIAGNOSIS — M25671 Stiffness of right ankle, not elsewhere classified: Secondary | ICD-10-CM | POA: Diagnosis not present

## 2021-11-29 DIAGNOSIS — M722 Plantar fascial fibromatosis: Secondary | ICD-10-CM | POA: Diagnosis not present

## 2021-12-07 DIAGNOSIS — Z1151 Encounter for screening for human papillomavirus (HPV): Secondary | ICD-10-CM | POA: Diagnosis not present

## 2021-12-07 DIAGNOSIS — Z01419 Encounter for gynecological examination (general) (routine) without abnormal findings: Secondary | ICD-10-CM | POA: Diagnosis not present

## 2021-12-07 DIAGNOSIS — N912 Amenorrhea, unspecified: Secondary | ICD-10-CM | POA: Diagnosis not present

## 2021-12-07 DIAGNOSIS — Z124 Encounter for screening for malignant neoplasm of cervix: Secondary | ICD-10-CM | POA: Diagnosis not present

## 2021-12-07 DIAGNOSIS — Z1231 Encounter for screening mammogram for malignant neoplasm of breast: Secondary | ICD-10-CM | POA: Diagnosis not present

## 2021-12-07 DIAGNOSIS — Z6837 Body mass index (BMI) 37.0-37.9, adult: Secondary | ICD-10-CM | POA: Diagnosis not present

## 2021-12-07 DIAGNOSIS — N939 Abnormal uterine and vaginal bleeding, unspecified: Secondary | ICD-10-CM | POA: Diagnosis not present

## 2022-01-04 DIAGNOSIS — N926 Irregular menstruation, unspecified: Secondary | ICD-10-CM | POA: Diagnosis not present

## 2022-01-16 DIAGNOSIS — Z113 Encounter for screening for infections with a predominantly sexual mode of transmission: Secondary | ICD-10-CM | POA: Diagnosis not present

## 2022-01-16 DIAGNOSIS — R87611 Atypical squamous cells cannot exclude high grade squamous intraepithelial lesion on cytologic smear of cervix (ASC-H): Secondary | ICD-10-CM | POA: Diagnosis not present

## 2022-01-16 DIAGNOSIS — N72 Inflammatory disease of cervix uteri: Secondary | ICD-10-CM | POA: Diagnosis not present

## 2022-01-16 DIAGNOSIS — Z30430 Encounter for insertion of intrauterine contraceptive device: Secondary | ICD-10-CM | POA: Diagnosis not present

## 2022-01-16 DIAGNOSIS — Z3202 Encounter for pregnancy test, result negative: Secondary | ICD-10-CM | POA: Diagnosis not present

## 2022-03-01 ENCOUNTER — Other Ambulatory Visit (HOSPITAL_COMMUNITY): Payer: Self-pay

## 2022-03-01 DIAGNOSIS — Z30432 Encounter for removal of intrauterine contraceptive device: Secondary | ICD-10-CM | POA: Diagnosis not present

## 2022-03-01 DIAGNOSIS — Z309 Encounter for contraceptive management, unspecified: Secondary | ICD-10-CM | POA: Diagnosis not present

## 2022-03-01 MED ORDER — NORETHINDRONE 0.35 MG PO TABS
1.0000 | ORAL_TABLET | Freq: Every day | ORAL | 3 refills | Status: DC
  Filled 2022-03-01: qty 84, 84d supply, fill #0

## 2022-04-12 ENCOUNTER — Other Ambulatory Visit (HOSPITAL_COMMUNITY): Payer: Self-pay

## 2022-04-12 ENCOUNTER — Ambulatory Visit: Payer: 59 | Admitting: Family

## 2022-04-12 VITALS — BP 106/71 | HR 66 | Temp 97.5°F | Resp 16 | Wt 218.0 lb

## 2022-04-12 DIAGNOSIS — L0291 Cutaneous abscess, unspecified: Secondary | ICD-10-CM

## 2022-04-12 MED ORDER — MUPIROCIN CALCIUM 2 % EX CREA
1.0000 | TOPICAL_CREAM | Freq: Two times a day (BID) | CUTANEOUS | 2 refills | Status: DC | PRN
  Filled 2022-04-12: qty 15, 30d supply, fill #0

## 2022-04-12 NOTE — Assessment & Plan Note (Signed)
Recurrent.  Could be mild form or hidradenitis suppurativa.  We discussed using an antibacterial soap daily.  Apply mupirocin cream bid to affected areas, warm compresses prn.  If abscess is large or worsening she should schedule an in office visit for consideration of oral antibiotics.  Pt is requesting referral to dermatology.

## 2022-04-12 NOTE — Progress Notes (Signed)
Subjective:     Patient ID: Andrea Bird, female    DOB: 04-28-1976, 46 y.o.   MRN: [id number]  Chief Complaint  Patient presents with   Recurrent Skin Infections    Complains of recurrent abscess on and off throughout body. Does not have one at this time.     HPI Patient is in today with c/o recurrent skin boils.  She is accompanied today by her son who helps with translation. The boils become sore.  They vary in location. She does not have any sores today.  They can come up anywhere on her body.  Typically they resolve on their own in about 1 week.    Health Maintenance Due  Topic Date Due   HIV Screening  Never done   Hepatitis C Screening  Never done   TETANUS/TDAP  Never done   PAP SMEAR-Modifier  Never done   COVID-19 Vaccine (3 - Pfizer series) 08/20/2019   INFLUENZA VACCINE  01/10/2022    No past medical history on file.  No past surgical history on file.  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     diclofenac (VOLTAREN) 75 MG EC tablet Take 1 tablet (75 mg total) by mouth 2 (two) times daily. 60 tablet 1   meloxicam (MOBIC) 15 MG tablet Take 1 tablet (15 mg total) by mouth daily. 30 tablet 1   norethindrone (CAMILA) 0.35 MG tablet Take 1 tablet (0.35 mg total) by mouth daily. 84 tablet 3   Facility-Administered Medications Prior to Visit  Medication Dose Route Frequency Provider Last Rate Last Admin   betamethasone acetate-betamethasone sodium phosphate (CELESTONE) injection 3 mg  3 mg Intra-articular Once Evans, Brent M, DPM        No Known Allergies  ROS See HPI    Objective:    Physical Exam Pulmonary:     Effort: Pulmonary effort is normal.  Skin:    Comments: Reviewed photo she brings of a superficial appearing boil on her stomach which has now healed.    Neurological:     Mental Status: She is alert.     BP 106/71 (BP Location: Right Arm, Patient Position: Sitting, Cuff Size: Large)   Pulse 66   Temp (!) 97.5 F (36.4 C) (Oral)   Resp 16   Wt 218 lb (  98.9 kg)   SpO2 100%   BMI 37.42 kg/m  Wt Readings from Last 3 Encounters:  04/12/22 218 lb (98.9 kg)  12/22/20 210 lb (95.3 kg)  11/10/20 211 lb 3.2 oz (95.8 kg)       Assessment & Plan:   Problem List Items Addressed This Visit       Unprioritized   Abscess of skin - Primary    Recurrent.  Could be mild form or hidradenitis suppurativa.  We discussed using an antibacterial soap daily.  Apply mupirocin cream bid to affected areas, warm compresses prn.  If abscess is large or worsening she should schedule an in office visit for consideration of oral antibiotics.  Pt is requesting referral to dermatology.      Relevant Orders   Ambulatory referral to Dermatology    I have discontinued Kiala Bolser's meloxicam, diclofenac, and norethindrone. I am also having her start on mupirocin cream. Additionally, I am having her maintain her ibuprofen. We will  continue to administer betamethasone acetate-betamethasone sodium phosphate.  Meds ordered this encounter  Medications   mupirocin cream (BACTROBAN) 2 %    Sig: Apply 1 Application topically 2 (two) times daily as needed.    Dispense:  15 g    Refill:  2    Order Specific Question:   Supervising Provider    Answer:   Penni Homans A [id number]

## 2022-07-21 IMAGING — DX DG CERVICAL SPINE 2 OR 3 VIEWS
4 series · 4 of 4 positions shown · non-contrast
Comparison: None.

CLINICAL DATA: Cervicalgia

EXAM:
CERVICAL SPINE - 2-3 VIEW

[c-spine lat]
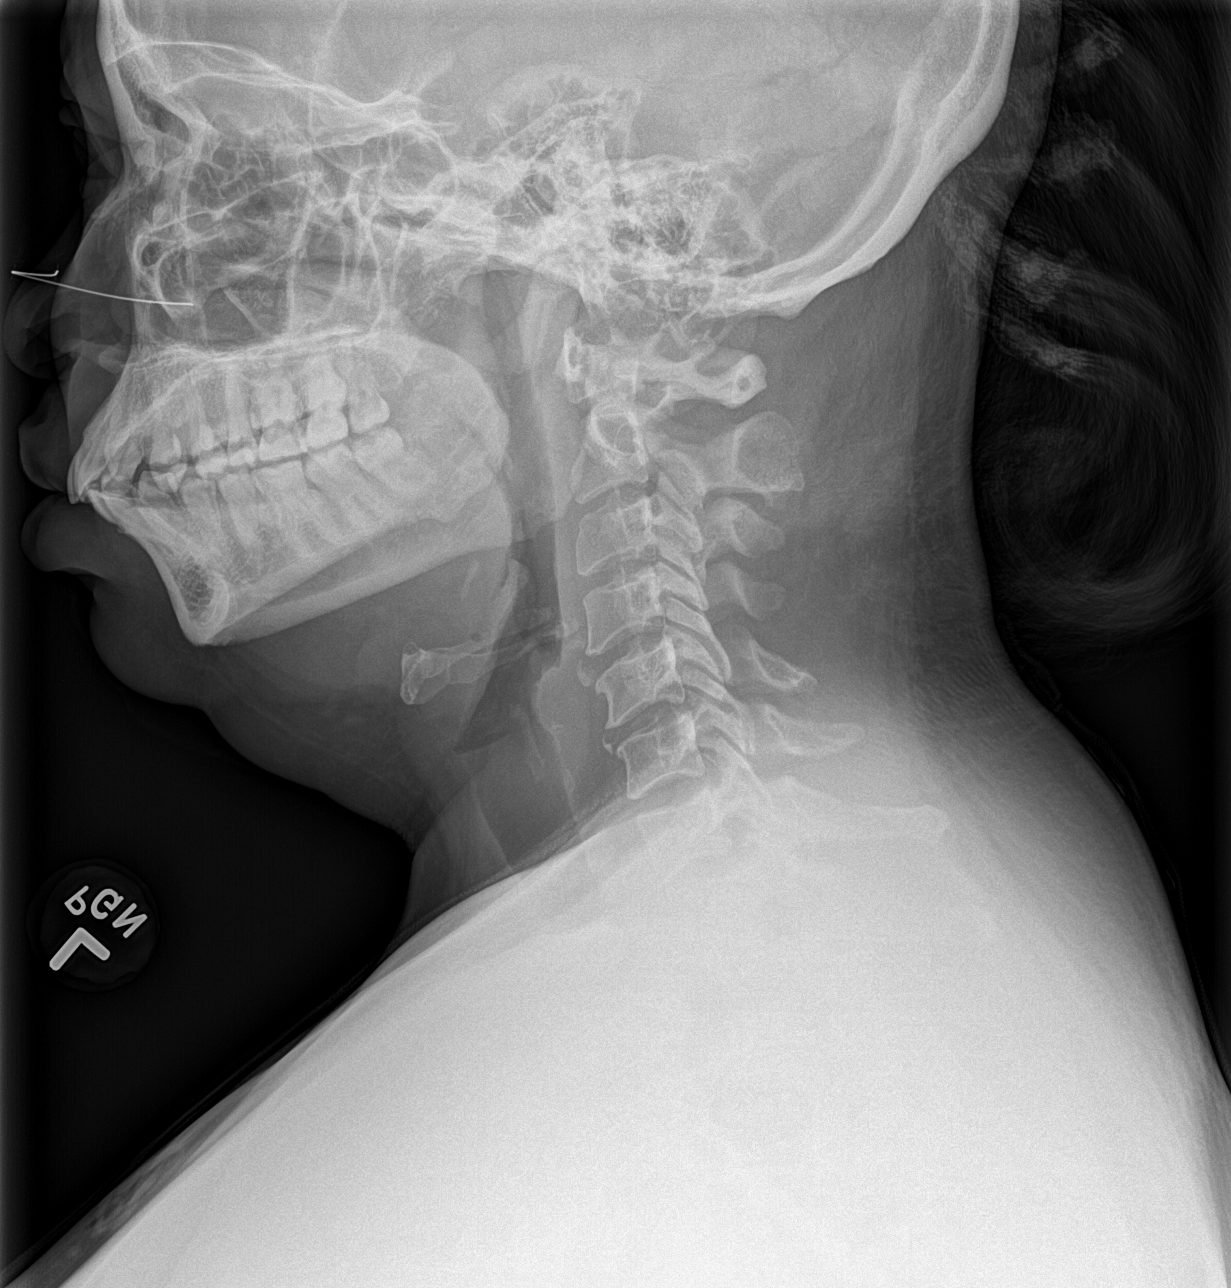

[c-spine ap]
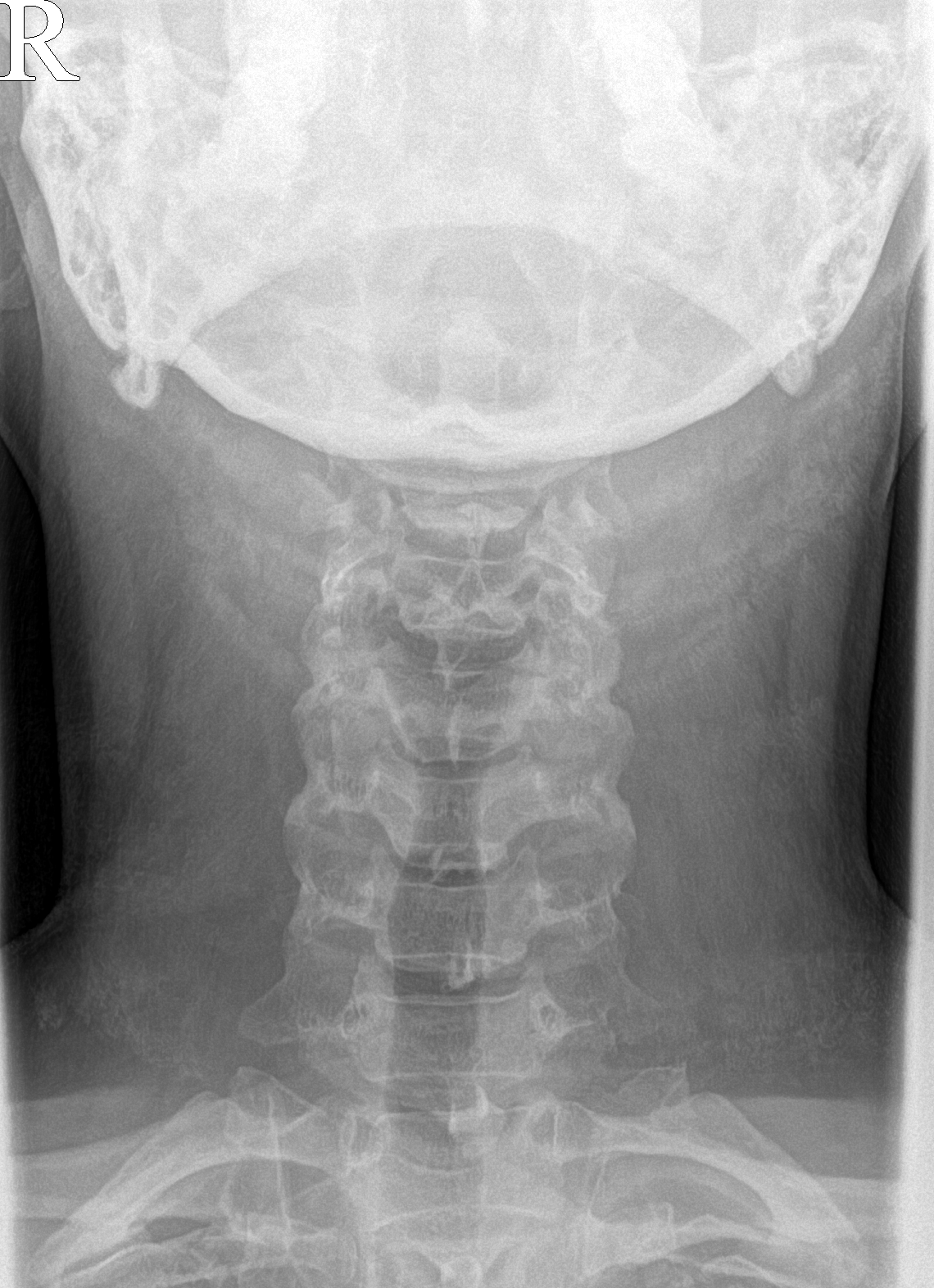

[ct-spine swimmers]
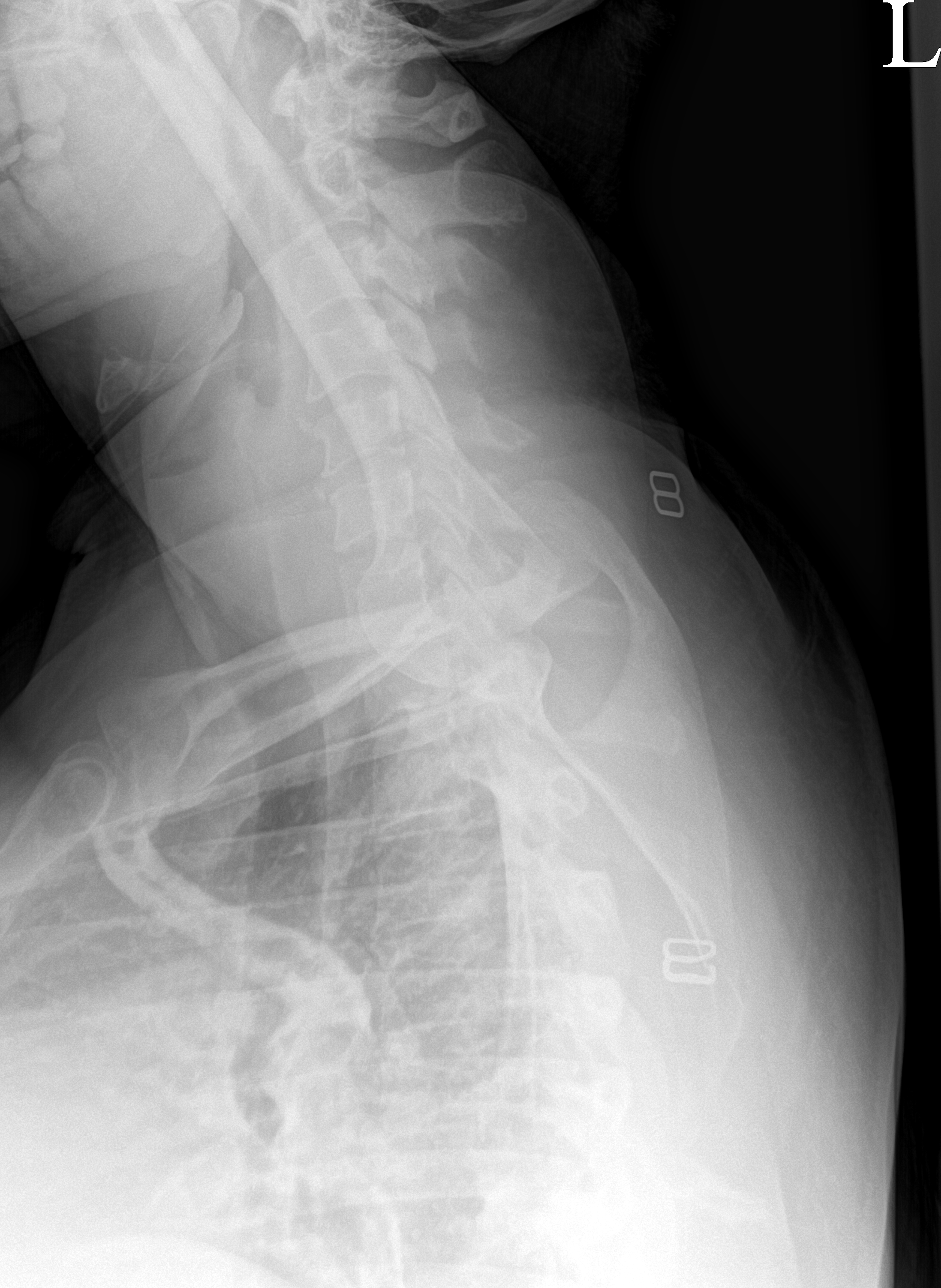

[c-spine open mouth]
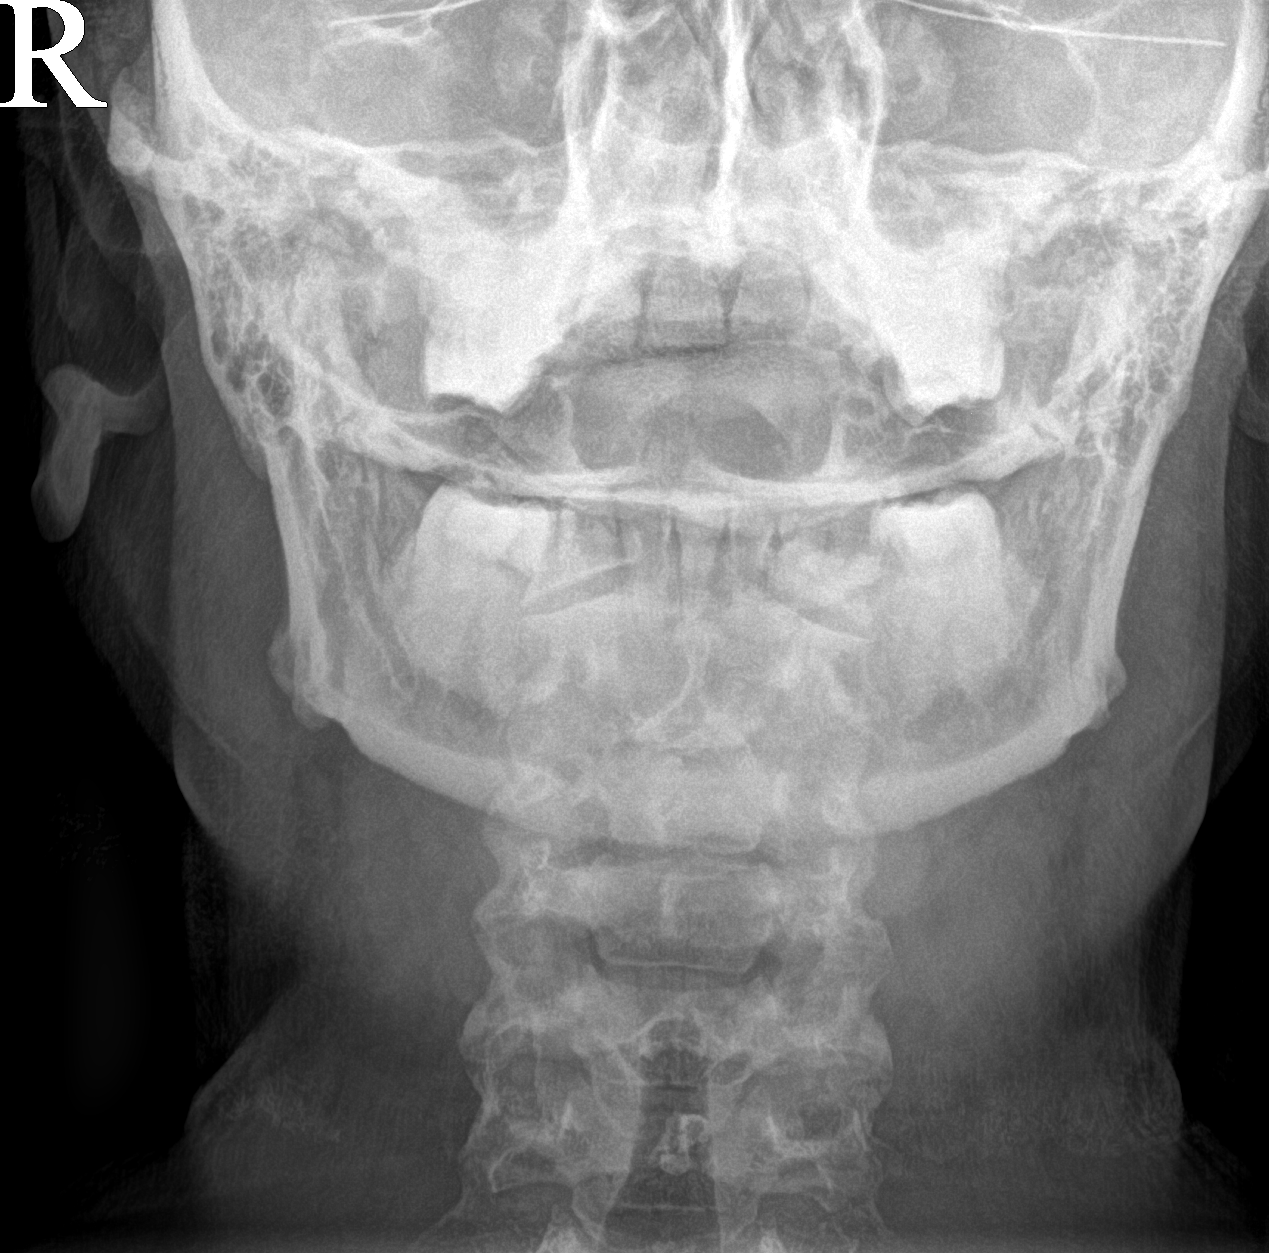

[4 of 4 positions shown; findings below may reference images not displayed]

FINDINGS: Frontal, lateral, and open-mouth odontoid images were obtained.
There is no fracture or spondylolisthesis. Prevertebral soft tissues
and predental space regions are normal. The disc spaces appear
normal. There are small anterior osteophytes at C4, C5, and C6. No
erosive change. Lung apices are clear.
IMPRESSION: No fracture or spondylolisthesis. No appreciable disc space
narrowing.

## 2022-08-16 ENCOUNTER — Ambulatory Visit (INDEPENDENT_AMBULATORY_CARE_PROVIDER_SITE_OTHER): Payer: Commercial Managed Care - PPO

## 2022-08-16 ENCOUNTER — Ambulatory Visit: Payer: Commercial Managed Care - PPO | Admitting: Podiatry

## 2022-08-16 ENCOUNTER — Telehealth: Payer: Self-pay | Admitting: *Deleted

## 2022-08-16 ENCOUNTER — Other Ambulatory Visit (HOSPITAL_COMMUNITY): Payer: Self-pay

## 2022-08-16 DIAGNOSIS — M7751 Other enthesopathy of right foot: Secondary | ICD-10-CM | POA: Diagnosis not present

## 2022-08-16 MED ORDER — METHYLPREDNISOLONE 4 MG PO TBPK
ORAL_TABLET | ORAL | 0 refills | Status: DC
  Filled 2022-08-16: qty 21, 6d supply, fill #0

## 2022-08-16 MED ORDER — MELOXICAM 15 MG PO TABS
15.0000 mg | ORAL_TABLET | Freq: Every day | ORAL | 1 refills | Status: DC
  Filled 2022-08-16: qty 30, 30d supply, fill #0

## 2022-08-16 NOTE — Progress Notes (Signed)
   Chief Complaint  Patient presents with   Foot Injury    Patient came in today for right ankle pain, rate of pain 9 out of 10, having pain in the morning and after resting or sitting, sharp pain, X-rays done today,     HPI: 47 y.o. female presenting today for new complaint of pain and tenderness associated to the right ankle this been going on for few months now.  Gradual onset.  Denies a history of injury.  She says that throughout the day it is tolerable but when standing after sitting for prolonged period of time or when getting out of bed she has significant pain and tenderness which slowly resolves after the ankle loosens up.  Currently wearing an ankle brace during work.  Presenting for further treatment evaluation  No past medical history on file.  No past surgical history on file.  No Known Allergies   Physical Exam: General: The patient is alert and oriented x3 in no acute distress.  Dermatology: Skin is warm, dry and supple bilateral lower extremities. Negative for open lesions or macerations.  Vascular: Palpable pedal pulses bilaterally. Capillary refill within normal limits.  Negative for any significant edema or erythema  Neurological: Light touch and protective threshold grossly intact  Musculoskeletal Exam: No pedal deformities noted.  There is some tenderness with palpation with plantarflexion of the right ankle overlying the extensor tendons.  There is no pain with direct palpation of the ankle joint  Radiographic Exam RT ankle 08/16/2022:  Normal osseous mineralization. Joint spaces preserved. No fracture/dislocation/boney destruction.    Assessment: 1.  Extensor tendinitis right ankle   Plan of Care:  1. Patient evaluated. X-Rays reviewed.  2.  Patient declined cortisone injection 3.  Continue ankle brace 4.  Prescription for Medrol Dosepak 5.  Prescription for meloxicam 15 mg daily 6.  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 From [place].       Edrick Kins, DPM [facility]  Dr. Edrick Kins, DPM    [address] [place], [place] [address]                Office [phone]  Fax [phone]

## 2022-08-16 NOTE — Telephone Encounter (Signed)
Handicap placard has been approved ,completed and given to patient-08/16/22.

## 2022-09-25 ENCOUNTER — Other Ambulatory Visit (HOSPITAL_COMMUNITY): Payer: Self-pay

## 2022-09-25 ENCOUNTER — Telehealth: Payer: Self-pay | Admitting: *Deleted

## 2022-09-25 MED ORDER — MELOXICAM 15 MG PO TABS
15.0000 mg | ORAL_TABLET | Freq: Every day | ORAL | 1 refills | Status: DC
  Filled 2022-09-25: qty 30, 30d supply, fill #0
  Filled 2022-11-17: qty 30, 30d supply, fill #1

## 2022-09-25 NOTE — Telephone Encounter (Signed)
Patient has been updated thru voice message that medication was sent to pharmacy.

## 2022-09-25 NOTE — Telephone Encounter (Addendum)
Patient's son is calling to ask for a refill of the meloxicam -15 mg for patient, please advise.

## 2022-10-11 DIAGNOSIS — L700 Acne vulgaris: Secondary | ICD-10-CM | POA: Diagnosis not present

## 2022-10-11 DIAGNOSIS — L02211 Cutaneous abscess of abdominal wall: Secondary | ICD-10-CM | POA: Diagnosis not present

## 2022-10-11 DIAGNOSIS — B9689 Other specified bacterial agents as the cause of diseases classified elsewhere: Secondary | ICD-10-CM | POA: Diagnosis not present

## 2022-11-17 ENCOUNTER — Other Ambulatory Visit (HOSPITAL_COMMUNITY): Payer: Self-pay

## 2023-01-31 ENCOUNTER — Other Ambulatory Visit (HOSPITAL_COMMUNITY): Payer: Self-pay

## 2023-01-31 DIAGNOSIS — Z01419 Encounter for gynecological examination (general) (routine) without abnormal findings: Secondary | ICD-10-CM | POA: Diagnosis not present

## 2023-01-31 DIAGNOSIS — Z309 Encounter for contraceptive management, unspecified: Secondary | ICD-10-CM | POA: Diagnosis not present

## 2023-01-31 DIAGNOSIS — Z1231 Encounter for screening mammogram for malignant neoplasm of breast: Secondary | ICD-10-CM | POA: Diagnosis not present

## 2023-01-31 DIAGNOSIS — R87611 Atypical squamous cells cannot exclude high grade squamous intraepithelial lesion on cytologic smear of cervix (ASC-H): Secondary | ICD-10-CM | POA: Diagnosis not present

## 2023-01-31 DIAGNOSIS — Z6837 Body mass index (BMI) 37.0-37.9, adult: Secondary | ICD-10-CM | POA: Diagnosis not present

## 2023-01-31 MED ORDER — NORETHINDRONE 0.35 MG PO TABS
1.0000 | ORAL_TABLET | Freq: Every day | ORAL | 3 refills | Status: DC
  Filled 2023-01-31: qty 84, 84d supply, fill #0

## 2023-03-19 DIAGNOSIS — Z8742 Personal history of other diseases of the female genital tract: Secondary | ICD-10-CM | POA: Diagnosis not present

## 2023-03-19 DIAGNOSIS — Z3202 Encounter for pregnancy test, result negative: Secondary | ICD-10-CM | POA: Diagnosis not present

## 2023-03-19 DIAGNOSIS — N72 Inflammatory disease of cervix uteri: Secondary | ICD-10-CM | POA: Diagnosis not present

## 2023-03-19 DIAGNOSIS — N870 Mild cervical dysplasia: Secondary | ICD-10-CM | POA: Diagnosis not present

## 2023-06-19 NOTE — Progress Notes (Signed)
 New Patient Office Visit  Subjective    Patient ID: Andrea Bird, female    DOB: [birth year]  Age: 48 y.o. MRN: [id number]  CC:  Chief Complaint  Patient presents with   Establish Care    Establish Care. Experiencing bilateral knee pain for several years. Pain worsens when laying or sitting for a while and just getting up. Patient currently treating with tylenol occasionally.    HPI Andrea Bird presents to establish care today. She is accompanied by her son who is acting as equities trader. Reports bilateral knee pain, worse when standing after sitting or lying down for a while.  States that the right knee is worse than the left.  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 Has been going on for years. Has been taking tylenol with little relief.  Denies swelling, bruising, erythema, heat to the area, known injury. Medical history as outlined below.   Outpatient Encounter Medications as of 06/20/2023  Medication Sig   ibuprofen (ADVIL,MOTRIN) 800 MG tablet Take 800 mg by mouth 3 (three) times daily.   meloxicam  (MOBIC ) 15 MG tablet Take 1 tablet (15 mg total) by mouth daily.   [DISCONTINUED] meloxicam  (MOBIC ) 15 MG tablet Take 1 tablet (15 mg total) by mouth daily. (Patient not taking: Reported on 06/20/2023)   [DISCONTINUED] methylPREDNISolone  (MEDROL  DOSEPAK) 4 MG TBPK tablet Take as directed (Patient not taking: Reported on 06/20/2023)   [DISCONTINUED] mupirocin  cream (BACTROBAN ) 2 % Apply 1 Application topically 2 (two) times daily as needed. (Patient not taking: Reported on 06/20/2023)   Facility-Administered Encounter Medications as of 06/20/2023  Medication   betamethasone  acetate-betamethasone  sodium phosphate (CELESTONE ) injection 3 mg    History reviewed. No pertinent past medical history.  History reviewed. No pertinent surgical history.  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  Constitutional:      General: She is not in acute distress.    Appearance: Normal appearance. She is obese.  HENT:     Head: Normocephalic and atraumatic.     Nose: Nose normal.  Eyes:  Extraocular Movements: Extraocular movements intact.     Pupils: Pupils are equal, round, and reactive to light.  Pulmonary:     Effort: Pulmonary effort is normal.  Musculoskeletal:        General: No swelling, tenderness, deformity or signs of injury. Normal range of motion.     Cervical back: Normal range of motion.  Neurological:     General: No focal deficit present.     Mental Status: She is alert and oriented to person, place, and time.  Psychiatric:        Mood and Affect: Mood normal.        Thought Content: Thought content normal.        Assessment & Plan:   Chronic pain of both knees -     DG Knee 1-2 Views Right; Future -     Meloxicam ; Take 1 tablet  (15 mg total) by mouth daily.  Dispense: 30 tablet; Refill: 0  Primary osteoarthritis of both knees -     DG Knee 1-2 Views Right; Future -     Meloxicam ; Take 1 tablet (15 mg total) by mouth daily.  Dispense: 30 tablet; Refill: 0  -Will eval right knee first since that is bothering her more -Will be in contact with x-ray results and refer as appropriate either to orthopedics or physical therapy  Return in about 4 weeks (around 07/18/2023) for Physical.   Corean Ku, FNP

## 2023-06-20 ENCOUNTER — Ambulatory Visit (INDEPENDENT_AMBULATORY_CARE_PROVIDER_SITE_OTHER): Payer: Commercial Managed Care - PPO

## 2023-06-20 ENCOUNTER — Ambulatory Visit: Payer: Commercial Managed Care - PPO | Admitting: Family Medicine

## 2023-06-20 ENCOUNTER — Encounter: Payer: Self-pay | Admitting: Family Medicine

## 2023-06-20 ENCOUNTER — Other Ambulatory Visit (HOSPITAL_COMMUNITY): Payer: Self-pay

## 2023-06-20 VITALS — BP 112/74 | HR 84 | Temp 98.5°F | Ht 64.0 in | Wt 220.8 lb

## 2023-06-20 DIAGNOSIS — G8929 Other chronic pain: Secondary | ICD-10-CM

## 2023-06-20 DIAGNOSIS — M25562 Pain in left knee: Secondary | ICD-10-CM

## 2023-06-20 DIAGNOSIS — M25561 Pain in right knee: Secondary | ICD-10-CM

## 2023-06-20 DIAGNOSIS — M170 Bilateral primary osteoarthritis of knee: Secondary | ICD-10-CM | POA: Diagnosis not present

## 2023-06-20 DIAGNOSIS — M1711 Unilateral primary osteoarthritis, right knee: Secondary | ICD-10-CM | POA: Diagnosis not present

## 2023-06-20 MED ORDER — MELOXICAM 15 MG PO TABS
15.0000 mg | ORAL_TABLET | Freq: Every day | ORAL | 0 refills | Status: DC
  Filled 2023-06-20: qty 30, 30d supply, fill #0

## 2023-06-20 NOTE — Patient Instructions (Addendum)
 I have sent in meloxicam  for you to take today.  May take 1 tablet once daily.  Do not take ibuprofen, Aleve , or aspirin with this medication.  May take Tylenol with this medication.  Eat with this medication, it can be a little rough on your stomach.  We are getting an xray today. We will be in contact with any abnormal results that require further attention.  If your x-ray is okay, we will refer to physical therapy.  If not, will refer to orthopedics as needed.  Follow-up with me for new or worsening symptoms.

## 2023-06-22 ENCOUNTER — Other Ambulatory Visit: Payer: Self-pay | Admitting: Family Medicine

## 2023-06-22 DIAGNOSIS — M1711 Unilateral primary osteoarthritis, right knee: Secondary | ICD-10-CM

## 2023-06-22 DIAGNOSIS — G8929 Other chronic pain: Secondary | ICD-10-CM

## 2023-07-18 ENCOUNTER — Encounter: Payer: Self-pay | Admitting: Family Medicine

## 2023-07-18 ENCOUNTER — Ambulatory Visit (INDEPENDENT_AMBULATORY_CARE_PROVIDER_SITE_OTHER): Payer: Commercial Managed Care - PPO | Admitting: Family Medicine

## 2023-07-18 VITALS — BP 108/72 | HR 70 | Temp 98.0°F | Ht 64.0 in | Wt 220.6 lb

## 2023-07-18 DIAGNOSIS — Z6837 Body mass index (BMI) 37.0-37.9, adult: Secondary | ICD-10-CM

## 2023-07-18 DIAGNOSIS — E782 Mixed hyperlipidemia: Secondary | ICD-10-CM | POA: Diagnosis not present

## 2023-07-18 DIAGNOSIS — Z0000 Encounter for general adult medical examination without abnormal findings: Secondary | ICD-10-CM | POA: Diagnosis not present

## 2023-07-18 DIAGNOSIS — Z1211 Encounter for screening for malignant neoplasm of colon: Secondary | ICD-10-CM

## 2023-07-18 DIAGNOSIS — E559 Vitamin D deficiency, unspecified: Secondary | ICD-10-CM | POA: Diagnosis not present

## 2023-07-18 DIAGNOSIS — M170 Bilateral primary osteoarthritis of knee: Secondary | ICD-10-CM | POA: Diagnosis not present

## 2023-07-18 LAB — CBC WITH DIFFERENTIAL/PLATELET
Basophils Absolute: 0 10*3/uL (ref 0.0–0.1)
Basophils Relative: 0.2 % (ref 0.0–3.0)
Eosinophils Absolute: 0.1 10*3/uL (ref 0.0–0.7)
Eosinophils Relative: 2.6 % (ref 0.0–5.0)
HCT: 36.6 % (ref 36.0–46.0)
Hemoglobin: 11.9 g/dL — ABNORMAL LOW (ref 12.0–15.0)
Lymphocytes Relative: 48.5 % — ABNORMAL HIGH (ref 12.0–46.0)
Lymphs Abs: 2.2 10*3/uL (ref 0.7–4.0)
MCHC: 32.5 g/dL (ref 30.0–36.0)
MCV: 88.1 fL (ref 78.0–100.0)
Monocytes Absolute: 0.4 10*3/uL (ref 0.1–1.0)
Monocytes Relative: 8.8 % (ref 3.0–12.0)
Neutro Abs: 1.8 10*3/uL (ref 1.4–7.7)
Neutrophils Relative %: 39.9 % — ABNORMAL LOW (ref 43.0–77.0)
Platelets: 227 10*3/uL (ref 150.0–400.0)
RBC: 4.15 Mil/uL (ref 3.87–5.11)
RDW: 13.2 % (ref 11.5–15.5)
WBC: 4.5 10*3/uL (ref 4.0–10.5)

## 2023-07-18 LAB — LIPID PANEL
Cholesterol: 178 mg/dL (ref 0–200)
HDL: 62.2 mg/dL (ref >39.00)
LDL Cholesterol: 90 mg/dL (ref 0–99)
NonHDL: 115.77
Total CHOL/HDL Ratio: 3
Triglycerides: 131 mg/dL (ref 0.0–149.0)
VLDL: 26.2 mg/dL (ref 0.0–40.0)

## 2023-07-18 LAB — COMPREHENSIVE METABOLIC PANEL
ALT: 31 U/L (ref 0–35)
AST: 26 U/L (ref 0–37)
Albumin: 4.2 g/dL (ref 3.5–5.2)
Alkaline Phosphatase: 66 U/L (ref 39–117)
BUN: 12 mg/dL (ref 6–23)
CO2: 28 meq/L (ref 19–32)
Calcium: 9.3 mg/dL (ref 8.4–10.5)
Chloride: 104 meq/L (ref 96–112)
Creatinine, Ser: 0.65 mg/dL (ref 0.40–1.20)
GFR: 106.24 mL/min (ref >60.00)
Glucose, Bld: 107 mg/dL — ABNORMAL HIGH (ref 70–99)
Potassium: 4 meq/L (ref 3.5–5.1)
Sodium: 140 meq/L (ref 135–145)
Total Bilirubin: 0.4 mg/dL (ref 0.2–1.2)
Total Protein: 7.5 g/dL (ref 6.0–8.3)

## 2023-07-18 LAB — VITAMIN D 25 HYDROXY (VIT D DEFICIENCY, FRACTURES): VITD: 47.05 ng/mL (ref 30.00–100.00)

## 2023-07-18 NOTE — Assessment & Plan Note (Signed)
 Continue efforts in healthy diet and activity level

## 2023-07-18 NOTE — Progress Notes (Signed)
 Complete physical exam  Patient: Andrea Bird   DOB: 1975-07-07   47 y.o. Female  MRN: [id number]  Subjective:    No chief complaint on file.   Andrea Bird is a 48 y.o. female who presents today for a complete physical exam. She reports consuming a general diet.  Walks for exercise  She generally feels fairly well. She reports sleeping well. She does not have additional problems to discuss today.  She may be due for Tdap vaccine, declines today. Due for colon cancer screening, will do cologuard today. She is accompanied by her son who is acting as her interpreter today. She does not have regular dental care. She does see the eye doctor yearly. Denies other concerns today.  Most recent fall risk assessment:    09/15/2020   11:05 AM  Fall Risk   Falls in the past year? 0  Number falls in past yr: 0  Injury with Fall? 0  Follow up Falls evaluation completed     Most recent depression screenings:    04/12/2022   12:36 PM 09/15/2020   11:05 AM  PHQ 2/9 Scores  PHQ - 2 Score 0 0  PHQ- 9 Score 0     Vision:Within last year  Patient Active Problem List   Diagnosis Date Noted   Obesity, morbid (HCC) 07/18/2023   Abscess of skin 04/12/2022   Lateral epicondylitis, right elbow 09/29/2020   Patellofemoral syndrome 06/26/2018   Low back pain 06/26/2018   Muscle pain, cervical 05/19/2015   Burning in the chest 05/19/2015   Normocytic anemia 06/27/2012   Multiple joint pain 06/24/2012   History reviewed. No pertinent past medical history. No Known Allergies    Patient Care Team: Alvia Krabbe, FNP as PCP - General (Family Medicine)   Outpatient Medications Prior to Visit  Medication Sig   meloxicam  (MOBIC ) 15 MG tablet Take 1 tablet (15 mg total) by mouth daily.   [DISCONTINUED] ibuprofen (ADVIL,MOTRIN) 800 MG tablet Take 800 mg by mouth 3 (three) times daily. (Patient not taking: Reported on 07/18/2023)   Facility-Administered Medications Prior to Visit   Medication Dose Route Frequency Provider   betamethasone  acetate-betamethasone  sodium phosphate (CELESTONE ) injection 3 mg  3 mg Intra-articular Once Evans, Brent M, DPM    ROS  Per HPI      Objective:     BP 108/72 (BP Location: Left Arm, Patient Position: Sitting, Cuff Size: Normal)   Pulse 70   Temp 98 F (36.7 C) (Oral)   Ht 5' 4 (1.626 m)   Wt 220 lb 9.6 oz (100.1 kg)   LMP  (LMP Unknown)   SpO2 97%   BMI 37.87 kg/m  BP Readings from Last 3 Encounters:  07/18/23 108/72  06/20/23 112/74  04/12/22 106/71   Wt Readings from Last 3 Encounters:  07/18/23 220 lb 9.6 oz (100.1 kg)  06/20/23 220 lb 12.8 oz (100.2 kg)  04/12/22 218 lb (98.9 kg)   SpO2 Readings from Last 3 Encounters:  07/18/23 97%  06/20/23 98%  04/12/22 100%      Physical Exam Vitals and nursing note reviewed.  Constitutional:      General: She is not in acute distress.    Appearance: Normal appearance. She is obese.  HENT:     Head: Normocephalic.     Right Ear: Tympanic membrane and ear canal normal.     Left Ear: Tympanic membrane and ear canal normal.     Nose: Nose normal.     Mouth/Throat:  Mouth: Mucous membranes are moist.     Pharynx: Oropharynx is clear.  Eyes:     Extraocular Movements: Extraocular movements intact.     Conjunctiva/sclera: Conjunctivae normal.     Pupils: Pupils are equal, round, and reactive to light.  Neck:     Thyroid : No thyromegaly.  Cardiovascular:     Rate and Rhythm: Normal rate and regular rhythm.     Pulses: Normal pulses.  Pulmonary:     Effort: Pulmonary effort is normal.     Breath sounds: Normal breath sounds.  Abdominal:     General: Abdomen is flat. Bowel sounds are normal.     Palpations: Abdomen is soft.  Genitourinary:    Comments: To GYN Musculoskeletal:        General: Normal range of motion.     Cervical back: Normal range of motion and neck supple.  Lymphadenopathy:     Cervical: No cervical adenopathy.  Skin:    General:  Skin is warm and dry.     Capillary Refill: Capillary refill takes less than 2 seconds.  Neurological:     General: No focal deficit present.     Mental Status: She is alert and oriented to person, place, and time.  Psychiatric:        Mood and Affect: Mood normal.        Behavior: Behavior normal.      No results found for any visits on 07/18/23. Last CBC Lab Results  Component Value Date   WBC 5.5 07/07/2020   HGB 12.2 07/07/2020   HCT 36.9 07/07/2020   MCV 87.1 07/07/2020   MCH 28.3 12/04/2016   RDW 13.2 07/07/2020   PLT 215.0 07/07/2020   Last metabolic panel Lab Results  Component Value Date   GLUCOSE 87 07/07/2020   NA 137 07/07/2020   K 4.3 07/07/2020   CL 102 07/07/2020   CO2 30 07/07/2020   BUN 10 07/07/2020   CREATININE 0.70 07/07/2020   GFR 106.60 07/07/2020   CALCIUM  9.9 07/07/2020   PROT 7.5 07/07/2020   ALBUMIN 4.2 07/07/2020   BILITOT 0.4 07/07/2020   ALKPHOS 57 07/07/2020   AST 20 07/07/2020   ALT 24 07/07/2020   Last lipids Lab Results  Component Value Date   CHOL 166 07/07/2020   HDL 60.40 07/07/2020   LDLCALC 70 07/07/2020   TRIG 179.0 (H) 07/07/2020   CHOLHDL 3 07/07/2020   Last hemoglobin A1c Lab Results  Component Value Date   HGBA1C 5.7 (H) 12/04/2016   Last thyroid  functions Lab Results  Component Value Date   TSH 1.07 07/07/2020   Last vitamin D  Lab Results  Component Value Date   VD25OH 22.47 (L) 07/07/2020         Assessment & Plan:    Routine Health Maintenance and Physical Exam   Health Maintenance  Topic Date Due   HIV Screening  Never done   Hepatitis C Screening  Never done   DTaP/Tdap/Td vaccine (1 - Tdap) Never done   Pap with HPV screening  Never done   Colon Cancer Screening  Never done   COVID-19 Vaccine (3 - 2024-25 season) 02/11/2023   Flu Shot  Completed   HPV Vaccine  Aged Out     Discussed health benefits of physical activity, and encouraged her to engage in regular exercise appropriate for  her age and condition.  Well adult exam -Preventative care discussed - UTD on PAP, mammograms  Primary osteoarthritis of both knees -Follow-up with  sports medicine  Vitamin D  deficiency -     VITAMIN D  25 Hydroxy (Vit-D Deficiency, Fractures); Future  Mixed hyperlipidemia -     Lipid panel -     Comprehensive metabolic panel -     CBC with Differential/Platelet  Obesity, morbid (HCC) Assessment & Plan: Continue efforts in healthy diet and activity level  Colon cancer screening -     Cologuard   Return in about 1 year (around 07/17/2024) for physical.     Corean Ku, FNP

## 2023-07-24 ENCOUNTER — Other Ambulatory Visit: Payer: Self-pay | Admitting: Family Medicine

## 2023-07-24 ENCOUNTER — Other Ambulatory Visit (HOSPITAL_COMMUNITY): Payer: Self-pay

## 2023-07-24 DIAGNOSIS — G8929 Other chronic pain: Secondary | ICD-10-CM

## 2023-07-24 DIAGNOSIS — M170 Bilateral primary osteoarthritis of knee: Secondary | ICD-10-CM

## 2023-07-24 MED ORDER — MELOXICAM 15 MG PO TABS
15.0000 mg | ORAL_TABLET | Freq: Every day | ORAL | 0 refills | Status: AC
  Filled 2023-07-24: qty 30, 30d supply, fill #0

## 2023-07-25 ENCOUNTER — Other Ambulatory Visit (HOSPITAL_COMMUNITY): Payer: Self-pay

## 2023-08-03 DIAGNOSIS — Z1211 Encounter for screening for malignant neoplasm of colon: Secondary | ICD-10-CM | POA: Diagnosis not present

## 2023-08-08 LAB — COLOGUARD: COLOGUARD: NEGATIVE

## 2024-02-04 DIAGNOSIS — Z01419 Encounter for gynecological examination (general) (routine) without abnormal findings: Secondary | ICD-10-CM | POA: Diagnosis not present

## 2024-02-04 DIAGNOSIS — Z124 Encounter for screening for malignant neoplasm of cervix: Secondary | ICD-10-CM | POA: Diagnosis not present

## 2024-02-04 DIAGNOSIS — Z1151 Encounter for screening for human papillomavirus (HPV): Secondary | ICD-10-CM | POA: Diagnosis not present

## 2024-02-04 DIAGNOSIS — Z6838 Body mass index (BMI) 38.0-38.9, adult: Secondary | ICD-10-CM | POA: Diagnosis not present

## 2024-02-04 DIAGNOSIS — Z309 Encounter for contraceptive management, unspecified: Secondary | ICD-10-CM | POA: Diagnosis not present

## 2024-02-04 DIAGNOSIS — R87611 Atypical squamous cells cannot exclude high grade squamous intraepithelial lesion on cytologic smear of cervix (ASC-H): Secondary | ICD-10-CM | POA: Diagnosis not present

## 2024-05-21 DIAGNOSIS — Z1231 Encounter for screening mammogram for malignant neoplasm of breast: Secondary | ICD-10-CM | POA: Diagnosis not present
